# Patient Record
Sex: Male | Born: 1970 | Race: White | Hispanic: No | Marital: Married | Smoking: Never smoker
Health system: Southern US, Community
[De-identification: ages and names within clinical notes are randomized; demographics above are authoritative.]

## PROBLEM LIST (undated history)

## (undated) DIAGNOSIS — M1990 Unspecified osteoarthritis, unspecified site: Secondary | ICD-10-CM

## (undated) DIAGNOSIS — IMO0001 Reserved for inherently not codable concepts without codable children: Secondary | ICD-10-CM

## (undated) HISTORY — PX: COLONOSCOPY: SHX174

## (undated) HISTORY — DX: Reserved for inherently not codable concepts without codable children: IMO0001

## (undated) HISTORY — PX: OTHER SURGICAL HISTORY: SHX169

---

## 2014-10-18 ENCOUNTER — Ambulatory Visit (INDEPENDENT_AMBULATORY_CARE_PROVIDER_SITE_OTHER): Payer: BLUE CROSS/BLUE SHIELD | Admitting: Family Medicine

## 2014-10-18 ENCOUNTER — Encounter: Payer: Self-pay | Admitting: Family Medicine

## 2014-10-18 VITALS — BP 122/80 | Temp 98.5°F | Ht 69.0 in | Wt 203.0 lb

## 2014-10-18 DIAGNOSIS — Z0000 Encounter for general adult medical examination without abnormal findings: Secondary | ICD-10-CM

## 2014-10-18 NOTE — Patient Instructions (Signed)
Physical today-repeat in 1 year  Bring us a copy of your recent labs Have the front desk send for a copy of your records from [place]  Would recommend trying to push sleep at least to 7 hours  Keep an eye on 2 spots on the skin  No prostate cancer screening (rectal exam) until at least 51  Great to meet you!

## 2014-10-18 NOTE — Progress Notes (Signed)
  Tana ConchStephen Samson Ralph, MD Phone: [phone]  Subjective:  Patient presents today to establish care and for annual physical. Chief complaint-noted.  He exercises 5-6 days a week with crossfit and eats well. He is in excellent shaped. We discussed falsehood of BMI with his muscle mass.   The following were reviewed and entered/updated in epic: Past Medical History  Diagnosis Date  . Healthy adult    Past Surgical History  Procedure Laterality Date  . None      Family History  Problem Relation Age of Onset  . Healthy Father   . Healthy Mother   . Alcohol abuse Paternal Grandfather    Medications- none  Allergies-reviewed and updated No Known Allergies  History   Social History  . Marital Status: Married    Spouse Name: N/A  . Number of Children: N/A  . Years of Education: N/A   Social History Main Topics  . Smoking status: Never Smoker   . Smokeless tobacco: Not on file  . Alcohol Use: 1.2 - 3.6 oz/week    2-6 Standard drinks or equivalent per week  . Drug Use: No  . Sexual Activity:    Partners: Female     Comment: wife only   Other Topics Concern  . None   Social History Narrative   Married. Daughter in 10th grade (Dr. Selena BattenKim pt) and son in 8th grade in 2016 (Dr. Durene CalHunter pt).       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 Oropharynx normal. TM normal. Eyes: sclera and lids normal, PERRLA Neck: no thyromegaly, no cervical lymphadenopathy CV: RRR no murmurs rubs or gallops Lungs: CTAB no crackles, wheeze, rhonchi Abdomen: soft/nontender/nondistended/normal bowel sounds. No rebound or guarding.  Ext: no  edema Skin: warm, dry, full skin exam completed. Multiple moles and many atypical.  2 on watch 5 x 3 mm with irregular medial border on left upper back, 3x3 mm lesion R arm dark with slight border irregularity Neuro: 5/5 strength in upper and lower extremities, normal gait, normal reflexes  Assessment/Plan:  44 y.o. male presenting for annual physical.  Health Maintenance counseling: 1. Anticipatory guidance: Patient counseled regarding regular dental exams in [place], wearing seatbelts, wearing sunscreen. No dermatologist.  2. Risk factor reduction:  Advised patient of need for regular exercise and diet rich and fruits and vegetables to reduce risk of heart attack and stroke.  3. Immunizations/screenings/ancillary studies- up to date, once we get a copy of tetanus shot 4. Patient will bring by copy of labs  Return in 1 year. Keep an eye on 2 spots on skin exam. With multitude of moles, consider dermatology vs. Biopsies. Requesting records from [place].

## 2015-03-14 ENCOUNTER — Ambulatory Visit (INDEPENDENT_AMBULATORY_CARE_PROVIDER_SITE_OTHER): Payer: BLUE CROSS/BLUE SHIELD | Admitting: Family Medicine

## 2015-03-14 ENCOUNTER — Encounter: Payer: Self-pay | Admitting: Family Medicine

## 2015-03-14 VITALS — BP 172/90 | HR 64 | Temp 98.3°F | Wt 207.0 lb

## 2015-03-14 DIAGNOSIS — M7989 Other specified soft tissue disorders: Secondary | ICD-10-CM | POA: Diagnosis not present

## 2015-03-14 DIAGNOSIS — M79642 Pain in left hand: Secondary | ICD-10-CM

## 2015-03-14 NOTE — Progress Notes (Signed)
Tana Conch, MD  Subjective:  BASTIEN STRAWSER is a 44 y.o. year old very pleasant male patient who presents with:  Left hand pain and swelling -Wednesday working out at [employer]. Lifting 300 lbs, knee caught elbow when trying to push up and left wrist got bent back severely. Has been icing it about a dozen times. Pain mild to moderate in intensity. Swelling seems to be worsening. OTC analgesics prn for pain. Reduced hand grip strength. Difficult to pinpoint pain. Stable in course pain wise, worsening edema as noted.  ROS- no fevers, chills.   Past Medical History- never smoker, previously healthy  Medications- reviewed and updated- none  Objective: BP 172/90 mmHg  Pulse 64  Temp(Src) 98.3 F (36.8 C)  Wt 207 lb (93.895 kg) Gen: NAD CV: RRR no murmurs rubs or gallops Lungs: CTAB no crackles, wheeze, rhonchi Abdomen: soft/nontender/nondistended/normal bowel sounds.  Ext: Left hand with severe swelling throughout. Difficult to identify landmarks but appears to have increased pain in over scaphoid on left hand. Also has diffuse pain throughout proximal hand and hard to differentiate which are is worse. 4/5 grip strength compared to right. Entire hand warm to palpation. Normal strength with flexion and extension at wrist.  Skin: warm, dry Neuro: intact distal sensation, strength as above  Assessment/Plan:  Left hand pain and swelling Urgent referral to orthopedics- sent to after hours clinic at [facility]. crossfit injury- Watched video patient had and with 300 lbs force flexing wrist back- almost FOOSH type injury. Hyperflexion and pronounced swelling concerning. Worried about scaphoid fracture vs. Other etiology and do not think could safely work up in office today.

## 2015-03-14 NOTE — Patient Instructions (Signed)
With severity of your swelling in the hand/wrist, I think you need orthopedic evaluation, please go to the after hours clinic at [facility]. We called them and stated they open closer to 5 so may be reasonable to get there then.

## 2015-03-27 LAB — BASIC METABOLIC PANEL
BUN: 23 mg/dL — AB (ref 4–21)
Creatinine: 1.4 mg/dL — AB (ref 0.6–1.3)
Glucose: 97 mg/dL

## 2015-03-27 LAB — HEPATIC FUNCTION PANEL
ALK PHOS: 83 U/L (ref 25–125)
ALT: 35 U/L (ref 10–40)
AST: 42 U/L — AB (ref 14–40)

## 2015-03-27 LAB — LIPID PANEL
Cholesterol: 151 mg/dL (ref 0–200)
HDL: 47 mg/dL (ref 35–70)
LDL CALC: 86 mg/dL
Triglycerides: 89 mg/dL (ref 40–160)

## 2015-03-27 LAB — HEMOGLOBIN A1C: HEMOGLOBIN A1C: 5.1 % (ref 4.0–6.0)

## 2015-04-03 ENCOUNTER — Telehealth: Payer: Self-pay | Admitting: Family Medicine

## 2015-04-03 NOTE — Telephone Encounter (Signed)
Pt applied for life insurance and had abnormal ekg. Pt will bring ekg results. Can I use sda slot for next wk?

## 2015-04-03 NOTE — Telephone Encounter (Signed)
Pt has been scheduled.  °

## 2015-04-03 NOTE — Telephone Encounter (Signed)
See below

## 2015-04-03 NOTE — Telephone Encounter (Signed)
yes

## 2015-04-07 ENCOUNTER — Ambulatory Visit (INDEPENDENT_AMBULATORY_CARE_PROVIDER_SITE_OTHER): Payer: BLUE CROSS/BLUE SHIELD | Admitting: Family Medicine

## 2015-04-07 ENCOUNTER — Encounter: Payer: Self-pay | Admitting: Family Medicine

## 2015-04-07 VITALS — BP 130/70 | HR 74 | Temp 98.3°F | Wt 203.0 lb

## 2015-04-07 DIAGNOSIS — R9431 Abnormal electrocardiogram [ECG] [EKG]: Secondary | ICD-10-CM | POA: Diagnosis not present

## 2015-04-08 DIAGNOSIS — R9431 Abnormal electrocardiogram [ECG] [EKG]: Secondary | ICD-10-CM | POA: Insufficient documentation

## 2015-04-08 NOTE — Progress Notes (Signed)
Tana Conch, MD  Subjective:  Jose Cochran is a 44 y.o. year old very pleasant male patient who presents with:  Abnormal EKG -Patient brings EKG from life insurance company that was reportedly abnormal based on a t wave. He does have an isolated inverted t wave in lead III on EKG. He has some ST elevation in V2-V3 due to early repolarization. Patient is completely asymptomatic. EKG was done in recent weeks. No prior EKG has been completed due to being asymptomatic and no medical indication for EKG.   ROS- no chest pain, shortness of breath, palpitations, dizziness  Past Medical History- healthy athletic adult  Medications- reviewed and updated, none  Objective: BP 130/70 mmHg  Pulse 74  Temp(Src) 98.3 F (36.8 C)  Wt 203 lb (92.08 kg) Gen: NAD, resting comfortably in chair CV: RRR no murmurs rubs or gallops Lungs: CTAB no crackles, wheeze, rhonchi Abdomen: soft/nontender/nondistended/normal bowel sounds. No rebound or guarding.  Ext: no edema Skin: warm, dry, no rash Neuro: grossly normal, moves all extremities  EKG: rate 64, normal sinus rhythm, normal axis, no hypertrophy, st elevation in v2 and v3 due to early repolarization, isolated inverted t wave in III (given asymptomatic highly suspect benign)  Assessment/Plan:  Nonspecific abnormal electrocardiogram (ECG) (EKG) Reviewed EKG from life insurance and reviewed EKG in office. Patient has an isolated inverted t wave in III which has very high probability of being benign given healthy and asymptomatic. He has a repolarization abnormality causing st elevation in v2 and v3. Neither of these issues place him at increased cardiac risk or reflect the need to be in a higher tier for insurance. I will forward this note as well as any other needed information to the life insurance company. I am happy to speak with a physician from that company to discuss my findings.    Orders Placed This Encounter  Procedures  . EKG 12-Lead

## 2015-04-08 NOTE — Assessment & Plan Note (Signed)
Reviewed EKG from life insurance and reviewed EKG in office. Patient has an isolated inverted t wave in III which has very high probability of being benign given healthy and asymptomatic. He has a repolarization abnormality causing st elevation in v2 and v3. Neither of these issues place him at increased cardiac risk or reflect the need to be in a higher tier for insurance. I will forward this note as well as any other needed information to the life insurance company. I am happy to speak with a physician from that company to discuss my findings.

## 2015-04-09 ENCOUNTER — Encounter: Payer: Self-pay | Admitting: Family Medicine

## 2015-04-09 LAB — HIV ANTIBODY: HIV: NEGATIVE

## 2015-04-10 NOTE — Progress Notes (Signed)
Paperwork faxed °

## 2015-04-10 NOTE — Progress Notes (Signed)
Called and lm for pt tcb. 

## 2016-03-05 ENCOUNTER — Other Ambulatory Visit: Payer: Self-pay | Admitting: Orthopedic Surgery

## 2016-03-05 DIAGNOSIS — M25561 Pain in right knee: Secondary | ICD-10-CM

## 2016-03-08 ENCOUNTER — Ambulatory Visit (INDEPENDENT_AMBULATORY_CARE_PROVIDER_SITE_OTHER): Payer: BLUE CROSS/BLUE SHIELD

## 2016-03-08 DIAGNOSIS — M7121 Synovial cyst of popliteal space [Baker], right knee: Secondary | ICD-10-CM

## 2016-03-08 DIAGNOSIS — M25461 Effusion, right knee: Secondary | ICD-10-CM

## 2016-03-08 DIAGNOSIS — M25561 Pain in right knee: Secondary | ICD-10-CM

## 2016-03-08 DIAGNOSIS — M23221 Derangement of posterior horn of medial meniscus due to old tear or injury, right knee: Secondary | ICD-10-CM

## 2016-03-23 HISTORY — PX: ARTHOSCOPIC ROTAOR CUFF REPAIR: SHX5002

## 2017-03-17 IMAGING — MR MR KNEE*R* W/O CM
5 series · 37 of 40 positions shown · non-contrast
Comparison: None.

CLINICAL DATA: Knee pain and swelling starting in November 2015 with
limited mobility.

EXAM:
MRI OF THE RIGHT KNEE WITHOUT CONTRAST
TECHNIQUE: Multiplanar, multisequence MR imaging of the knee was performed. No
intravenous contrast was administered.

[Series 5: PD fat-sat · axial · 3.0mm · 0.33mm/px · z∈[-75,+41]mm · 8 of 36 slices shown (1 of 3)]
[im 1/36]
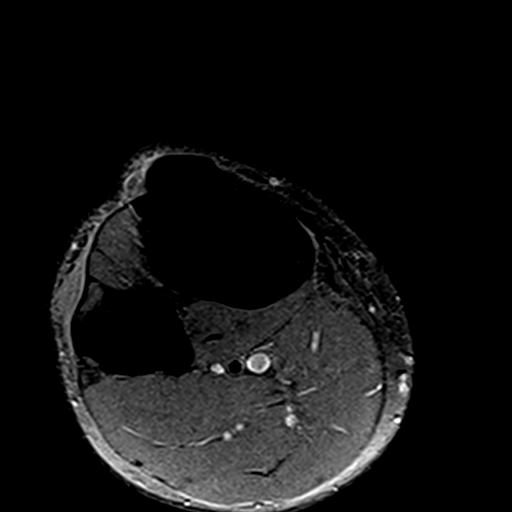
[im 6/36]
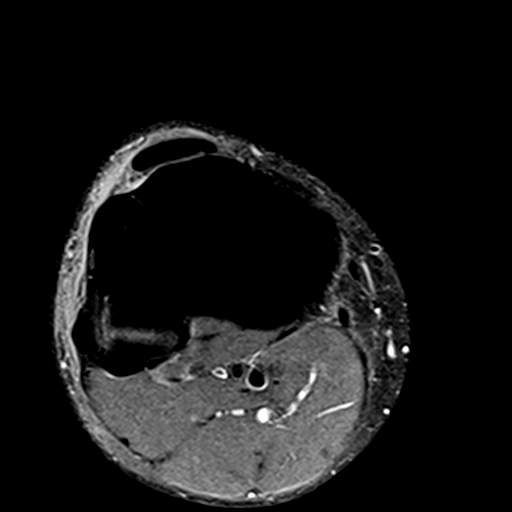
[im 11/36]
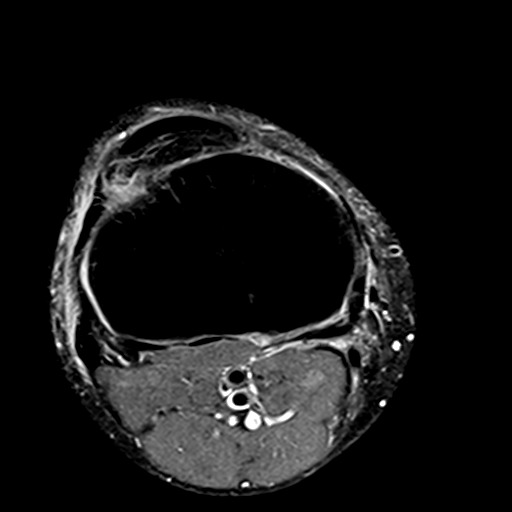
[im 16/36]
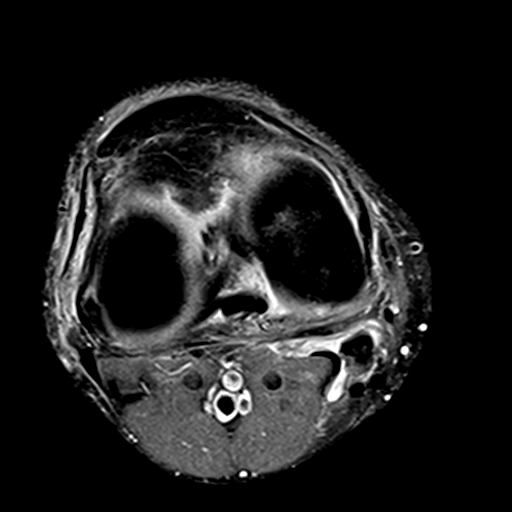
[im 21/36]
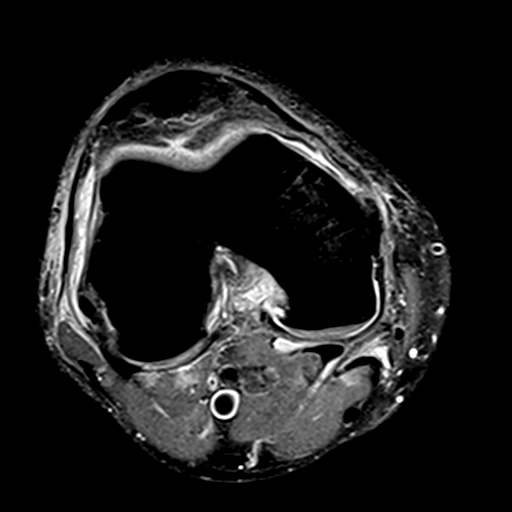
[im 26/36]
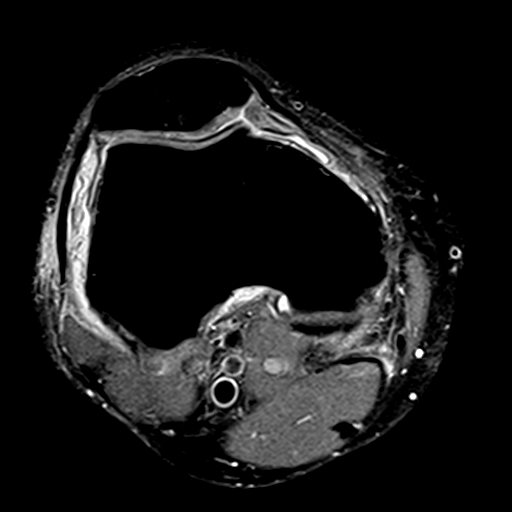
[im 31/36]
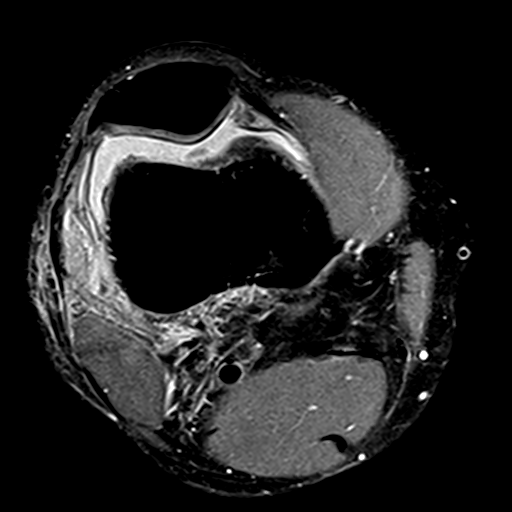
[im 36/36]
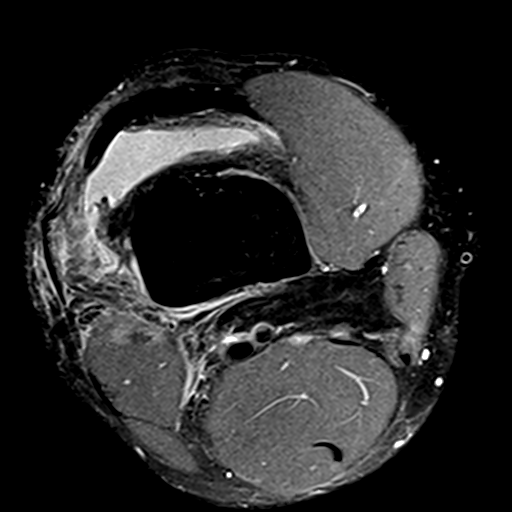

[Series 6: T1 · coronal · 3.0mm · 0.50mm/px · 5 of 35 slices shown]
[im 1/35]
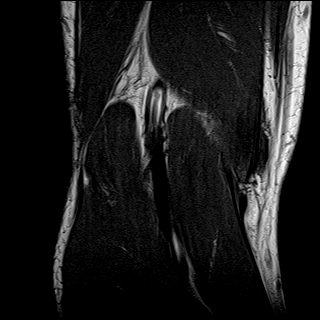
[im 5/35]
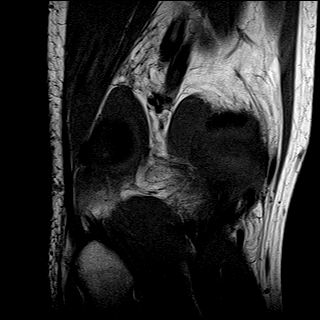
[im 10/35]
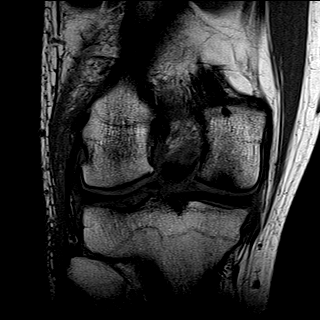
[im 15/35]
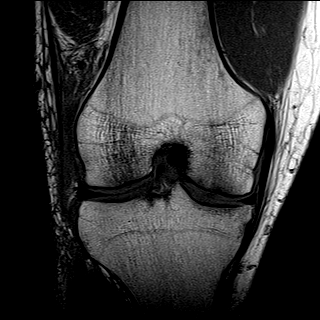
[im 20/35]
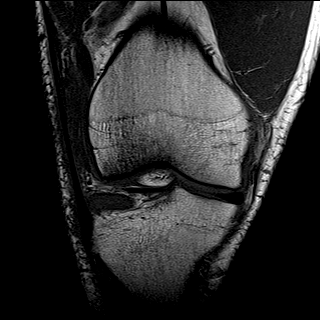

[Series 7: T2 fat-sat · coronal · 3.0mm · 0.50mm/px · 8 of 35 slices shown]
[im 1/35]
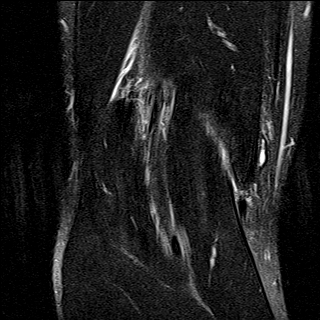
[im 5/35]
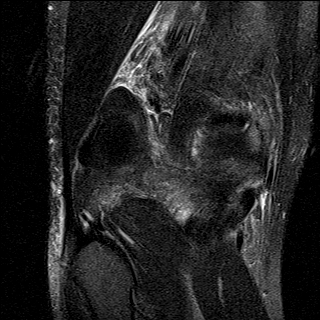
[im 10/35]
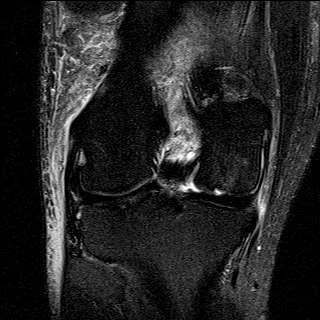
[im 15/35]
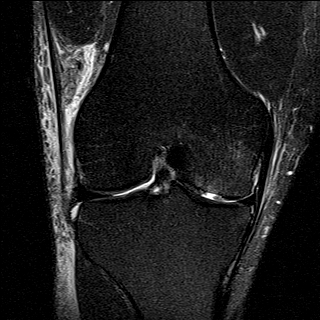
[im 20/35]
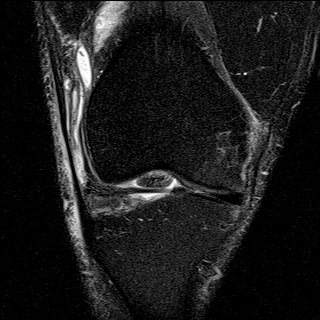
[im 25/35]
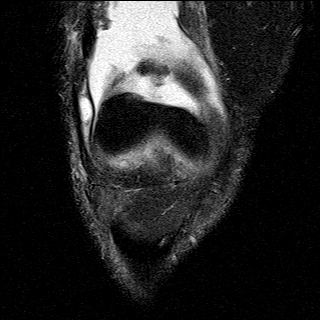
[im 30/35]
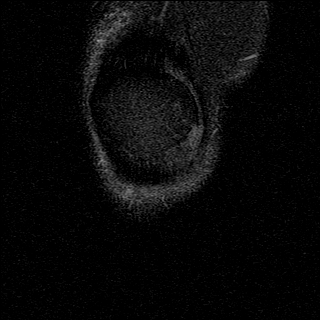
[im 35/35]
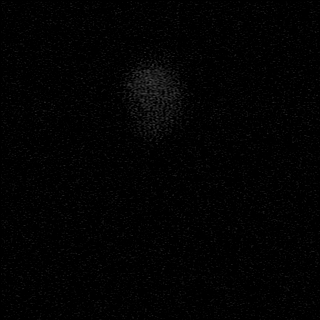

[Series 8: PD fat-sat · sagittal · 3.0mm · 0.62mm/px · 8 of 36 slices shown (2 of 3)]
[im 1/36]
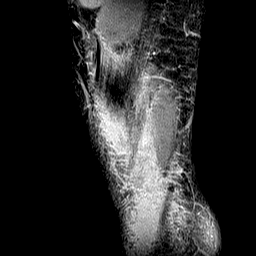
[im 6/36]
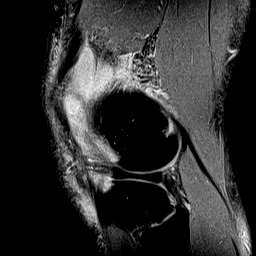
[im 11/36]
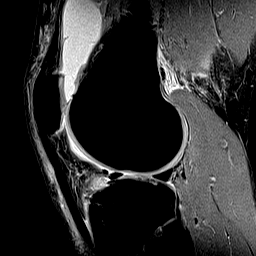
[im 16/36]
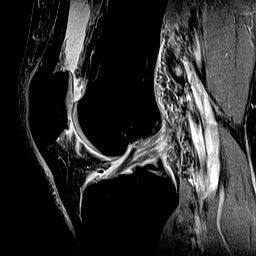
[im 21/36]
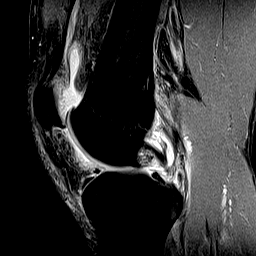
[im 26/36]
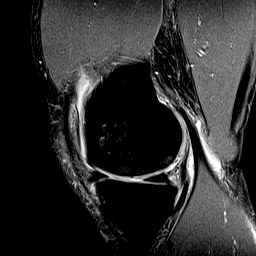
[im 31/36]
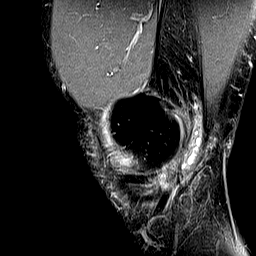
[im 36/36]
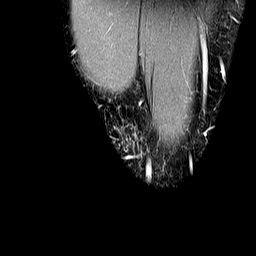

[Series 9: PD fat-sat · coronal · 3.0mm · 0.62mm/px · 8 of 35 slices shown (3 of 3)]
[im 1/35]
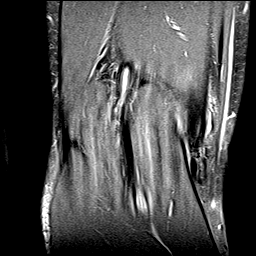
[im 5/35]
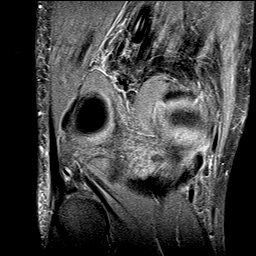
[im 10/35]
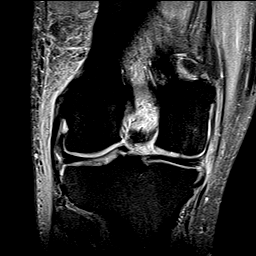
[im 15/35]
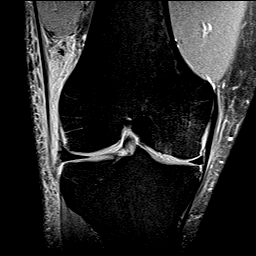
[im 20/35]
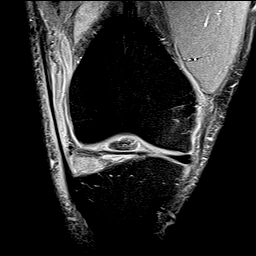
[im 25/35]
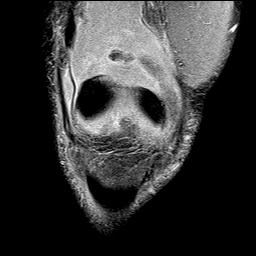
[im 30/35]
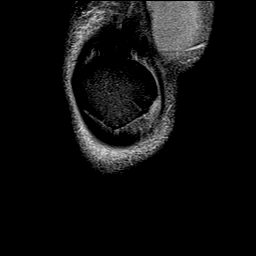
[im 35/35]
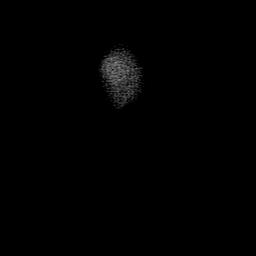

[37 of 40 positions shown; findings below may reference images not displayed]

FINDINGS: MENISCI

Medial meniscus: Irregular grade 3 peripheral oblique tear of the
posterior horn involving the inferior meniscal surface on images
29-30 series 8

Lateral meniscus:  No tear.  Incomplete discoid variant.

LIGAMENTS

Cruciates:  Unremarkable

Collaterals: Mild edema tracks adjacent to the MCL. This can be
incidental but in the appropriate clinical circumstance could
represent grade 1 sprain. There is considerable edema along the
anterolateral knee soft tissues but no discontinuity of the fibular
collateral ligament are popliteus tendon.

CARTILAGE

Patellofemoral: Mild chondral heterogeneity inferiorly in the
patellar cartilage with faint degenerative subcortical foci of edema
along the inferior patella.

Medial: Triangular full-thickness chondral defect in the articular
surface of the medial femoral condyle measures up to 9 mm at its
base and up to 33 mm in length, sagittally oriented, with associated
subcortical marrow edema, as shown on images 12-14 series 7.
Underlying mild chondral thinning noted.

Lateral: Mild chondral heterogeneity along the lateral tibial
plateau.

Joint:  Moderate to large knee effusion.

Popliteal Fossa:  Small Baker's cyst.

Extensor Mechanism: Edema tracks around the lateral patellar
retinaculum.

Bones: No significant extra-articular osseous abnormalities
identified.

Other: No supplemental non-categorized findings.
IMPRESSION: 1. Large full-thickness chondral defect oriented sagittally along
the medial femoral condyle with underlying subcortical marrow edema.
2. Small irregular grade 3 peripheral oblique tear of the posterior
horn medial meniscus involving the inferior meniscal surface and
meniscal periphery.
3. Mild edema tracks adjacent to the MCL. This can be incidental but
in the appropriate clinical circumstance could represent grade 1
sprain.
4. Edema tracks along the antrum lateral knee and a sprain of the
lateral patellar retinaculum is not excluded.
5. Moderate to large knee effusion with small Baker's cyst.
6. Mild chondral heterogeneity inferiorly in the patella and in the
lateral tibial plateau potentially reflecting mild chondromalacia.

## 2023-06-06 ENCOUNTER — Ambulatory Visit: Payer: Self-pay | Admitting: Orthopedic Surgery

## 2023-08-01 NOTE — Patient Instructions (Addendum)
DUE TO COVID-19 ONLY TWO VISITORS  (aged 51 and older)  ARE ALLOWED TO COME WITH YOU AND STAY IN THE WAITING ROOM ONLY DURING PRE OP AND PROCEDURE.   **NO VISITORS ARE ALLOWED IN THE SHORT STAY AREA OR RECOVERY ROOM!!**  IF YOU WILL BE ADMITTED INTO THE HOSPITAL YOU ARE ALLOWED ONLY FOUR SUPPORT PEOPLE DURING VISITATION HOURS ONLY (7 AM -8PM)   The support person(s) must pass our screening, gel in and out, and wear a mask at all times, including in the patient's room. Patients must also wear a mask when staff or their support person are in the room. Visitors GUEST BADGE MUST BE WORN VISIBLY  One adult visitor may remain with you overnight and MUST be in the room by 8 P.M.     Your procedure is scheduled on: 08/10/23   Report to [facility] Main Entrance    Report to admitting at : 6:00 AM   Call this number if you have problems the morning of surgery [phone]   Do not eat food :After Midnight.   After Midnight you may have the following liquids until : 5:30 AM DAY OF SURGERY  Water Black Coffee (sugar ok, NO MILK/CREAM OR CREAMERS)  Tea (sugar ok, NO MILK/CREAM OR CREAMERS) regular and decaf                             Plain Jell-O (NO RED)                                           Fruit ices (not with fruit pulp, NO RED)                                     Popsicles (NO RED)                                                                  Juice: apple, WHITE grape, WHITE cranberry Sports drinks like Gatorade (NO RED)   The day of surgery:  Drink ONE (1) Pre-Surgery Clear Ensure at : 5:30 AM the morning of surgery. Drink in one sitting. Do not sip.  This drink was given to you during your hospital  pre-op appointment visit. Nothing else to drink after completing the  Pre-Surgery Clear Ensure or G2.          If you have questions, please contact your surgeon's office.  FOLLOW ANY ADDITIONAL PRE OP INSTRUCTIONS YOU RECEIVED FROM YOUR SURGEON'S OFFICE!!!   Oral  Hygiene is also important to reduce your risk of infection.                                    Remember - BRUSH YOUR TEETH THE MORNING OF SURGERY WITH YOUR REGULAR TOOTHPASTE  DENTURES WILL BE REMOVED PRIOR TO SURGERY PLEASE DO NOT APPLY "Poly grip" OR ADHESIVES!!!   Do NOT smoke after Midnight   Take these medicines the morning of surgery  with A SIP OF WATER: NONE.                              You may not have any metal on your body including hair pins, jewelry, and body piercing             Do not wear lotions, powders, perfumes/cologne, or deodorant              Men may shave face and neck.   Do not bring valuables to the hospital. [facility] IS NOT             RESPONSIBLE   FOR VALUABLES.   Contacts, glasses, or bridgework may not be worn into surgery.   Bring small overnight bag day of surgery.   DO NOT BRING YOUR HOME MEDICATIONS TO THE HOSPITAL. PHARMACY WILL DISPENSE MEDICATIONS LISTED ON YOUR MEDICATION LIST TO YOU DURING YOUR ADMISSION IN THE HOSPITAL!    Patients discharged on the day of surgery will not be allowed to drive home.  Someone NEEDS to stay with you for the first 24 hours after anesthesia.   Special Instructions: Bring a copy of your healthcare power of attorney and living will documents         the day of surgery if you haven't scanned them before.              Please read over the following fact sheets you were given: IF YOU HAVE QUESTIONS ABOUT YOUR PRE-OP INSTRUCTIONS PLEASE CALL [phone]      Pre-operative 5 CHG Bath Instructions   You can play a key role in reducing the risk of infection after surgery. Your skin needs to be as free of germs as possible. You can reduce the number of germs on your skin by washing with CHG (chlorhexidine gluconate) soap before surgery. CHG is an antiseptic soap that kills germs and continues to kill germs even after washing.   DO NOT use if you have an allergy to chlorhexidine/CHG or antibacterial soaps. If your  skin becomes reddened or irritated, stop using the CHG and notify one of our RNs at : [phone].   Please shower with the CHG soap starting 4 days before surgery using the following schedule:     Please keep in mind the following:  DO NOT shave, including legs and underarms, starting the day of your first shower.   You may shave your face at any point before/day of surgery.  Place clean sheets on your bed the day you start using CHG soap. Use a clean washcloth (not used since being washed) for each shower. DO NOT sleep with pets once you start using the CHG.   CHG Shower Instructions:  If you choose to wash your hair and private area, wash first with your normal shampoo/soap.  After you use shampoo/soap, rinse your hair and body thoroughly to remove shampoo/soap residue.  Turn the water OFF and apply about 3 tablespoons (45 ml) of CHG soap to a CLEAN washcloth.  Apply CHG soap ONLY FROM YOUR NECK DOWN TO YOUR TOES (washing for 3-5 minutes)  DO NOT use CHG soap on face, private areas, open wounds, or sores.  Pay special attention to the area where your surgery is being performed.  If you are having back surgery, having someone wash your back for you may be helpful. Wait 2 minutes after CHG soap is applied, then you may rinse off  the CHG soap.  Pat dry with a clean towel  Put on clean clothes/pajamas   If you choose to wear lotion, please use ONLY the CHG-compatible lotions on the back of this paper.     Additional instructions for the day of surgery: DO NOT APPLY any lotions, deodorants, cologne, or perfumes.   Put on clean/comfortable clothes.  Brush your teeth.  Ask your nurse before applying any prescription medications to the skin.   CHG Compatible Lotions   Aveeno Moisturizing lotion  Cetaphil Moisturizing Cream  Cetaphil Moisturizing Lotion  Clairol Herbal Essence Moisturizing Lotion, Dry Skin  Clairol Herbal Essence Moisturizing Lotion, Extra Dry Skin  Clairol Herbal  Essence Moisturizing Lotion, Normal Skin  Curel Age Defying Therapeutic Moisturizing Lotion with Alpha Hydroxy  Curel Extreme Care Body Lotion  Curel Soothing Hands Moisturizing Hand Lotion  Curel Therapeutic Moisturizing Cream, Fragrance-Free  Curel Therapeutic Moisturizing Lotion, Fragrance-Free  Curel Therapeutic Moisturizing Lotion, Original Formula  Eucerin Daily Replenishing Lotion  Eucerin Dry Skin Therapy Plus Alpha Hydroxy Crme  Eucerin Dry Skin Therapy Plus Alpha Hydroxy Lotion  Eucerin Original Crme  Eucerin Original Lotion  Eucerin Plus Crme Eucerin Plus Lotion  Eucerin TriLipid Replenishing Lotion  Keri Anti-Bacterial Hand Lotion  Keri Deep Conditioning Original Lotion Dry Skin Formula Softly Scented  Keri Deep Conditioning Original Lotion, Fragrance Free Sensitive Skin Formula  Keri Lotion Fast Absorbing Fragrance Free Sensitive Skin Formula  Keri Lotion Fast Absorbing Softly Scented Dry Skin Formula  Keri Original Lotion  Keri Skin Renewal Lotion Keri Silky Smooth Lotion  Keri Silky Smooth Sensitive Skin Lotion  Nivea Body Creamy Conditioning Oil  Nivea Body Extra Enriched Lotion  Nivea Body Original Lotion  Nivea Body Sheer Moisturizing Lotion Nivea Crme  Nivea Skin Firming Lotion  NutraDerm 30 Skin Lotion  NutraDerm Skin Lotion  NutraDerm Therapeutic Skin Cream  NutraDerm Therapeutic Skin Lotion  ProShield Protective Hand Cream  Provon moisturizing lotion   Incentive Spirometer  An incentive spirometer is a tool that can help keep your lungs clear and active. This tool measures how well you are filling your lungs with each breath. Taking long deep breaths may help reverse or decrease the chance of developing breathing (pulmonary) problems (especially infection) following: A long period of time when you are unable to move or be active. BEFORE THE PROCEDURE  If the spirometer includes an indicator to show your best effort, your nurse or respiratory therapist  will set it to a desired goal. If possible, sit up straight or lean slightly forward. Try not to slouch. Hold the incentive spirometer in an upright position. INSTRUCTIONS FOR USE  Sit on the edge of your bed if possible, or sit up as far as you can in bed or on a chair. Hold the incentive spirometer in an upright position. Breathe out normally. Place the mouthpiece in your mouth and seal your lips tightly around it. Breathe in slowly and as deeply as possible, raising the piston or the ball toward the top of the column. Hold your breath for 3-5 seconds or for as long as possible. Allow the piston or ball to fall to the bottom of the column. Remove the mouthpiece from your mouth and breathe out normally. Rest for a few seconds and repeat Steps 1 through 7 at least 10 times every 1-2 hours when you are awake. Take your time and take a few normal breaths between deep breaths. The spirometer may include an indicator to show your best effort. Use the indicator  as a goal to work toward during each repetition. After each set of 10 deep breaths, practice coughing to be sure your lungs are clear. If you have an incision (the cut made at the time of surgery), support your incision when coughing by placing a pillow or rolled up towels firmly against it. Once you are able to get out of bed, walk around indoors and cough well. You may stop using the incentive spirometer when instructed by your caregiver.  RISKS AND COMPLICATIONS Take your time so you do not get dizzy or light-headed. If you are in pain, you may need to take or ask for pain medication before doing incentive spirometry. It is harder to take a deep breath if you are having pain. AFTER USE Rest and breathe slowly and easily. It can be helpful to keep track of a log of your progress. Your caregiver can provide you with a simple table to help with this. If you are using the spirometer at home, follow these instructions: SEEK MEDICAL CARE IF:   You are having difficultly using the spirometer. You have trouble using the spirometer as often as instructed. Your pain medication is not giving enough relief while using the spirometer. You develop fever of 100.5 F (38.1 C) or higher. SEEK IMMEDIATE MEDICAL CARE IF:  You cough up bloody sputum that had not been present before. You develop fever of 102 F (38.9 C) or greater. You develop worsening pain at or near the incision site. MAKE SURE YOU:  Understand these instructions. Will watch your condition. Will get help right away if you are not doing well or get worse. Document Released: 12/20/2006 Document Revised: 11/01/2011 Document Reviewed: 02/20/2007 [facility] Patient Information 2014 [place], [place].   ________________________________________________________________________

## 2023-08-02 ENCOUNTER — Encounter (HOSPITAL_COMMUNITY)
Admission: RE | Admit: 2023-08-02 | Discharge: 2023-08-02 | Disposition: A | Discharge status: Home / Self Care | Payer: BC Managed Care – PPO | Source: Ambulatory Visit | Attending: Specialist | Admitting: Specialist

## 2023-08-02 ENCOUNTER — Other Ambulatory Visit: Payer: Self-pay

## 2023-08-02 ENCOUNTER — Encounter (HOSPITAL_COMMUNITY): Payer: Self-pay

## 2023-08-02 VITALS — BP 167/108 | HR 79 | Temp 98.6°F | Ht 67.0 in | Wt 203.0 lb

## 2023-08-02 DIAGNOSIS — Z01812 Encounter for preprocedural laboratory examination: Secondary | ICD-10-CM | POA: Diagnosis present

## 2023-08-02 DIAGNOSIS — R9431 Abnormal electrocardiogram [ECG] [EKG]: Secondary | ICD-10-CM | POA: Insufficient documentation

## 2023-08-02 DIAGNOSIS — Z01818 Encounter for other preprocedural examination: Secondary | ICD-10-CM | POA: Diagnosis not present

## 2023-08-02 DIAGNOSIS — Z01810 Encounter for preprocedural cardiovascular examination: Secondary | ICD-10-CM | POA: Diagnosis present

## 2023-08-02 HISTORY — DX: Unspecified osteoarthritis, unspecified site: M19.90

## 2023-08-02 LAB — SURGICAL PCR SCREEN
MRSA, PCR: NEGATIVE
Staphylococcus aureus: NEGATIVE

## 2023-08-02 LAB — CBC
HCT: 51.4 % (ref 39.0–52.0)
Hemoglobin: 17 g/dL (ref 13.0–17.0)
MCH: 29.3 pg (ref 26.0–34.0)
MCHC: 33.1 g/dL (ref 30.0–36.0)
MCV: 88.5 fL (ref 80.0–100.0)
Platelets: 329 10*3/uL (ref 150–400)
RBC: 5.81 MIL/uL (ref 4.22–5.81)
RDW: 12.8 % (ref 11.5–15.5)
WBC: 7.7 10*3/uL (ref 4.0–10.5)
nRBC: 0 % (ref 0.0–0.2)

## 2023-08-02 LAB — BASIC METABOLIC PANEL
Anion gap: 10 (ref 5–15)
BUN: 17 mg/dL (ref 6–20)
CO2: 25 mmol/L (ref 22–32)
Calcium: 9.6 mg/dL (ref 8.9–10.3)
Chloride: 100 mmol/L (ref 98–111)
Creatinine, Ser: 1.39 mg/dL — ABNORMAL HIGH (ref 0.61–1.24)
GFR, Estimated: >60 mL/min (ref >60)
Glucose, Bld: 97 mg/dL (ref 70–99)
Potassium: 4.4 mmol/L (ref 3.5–5.1)
Sodium: 135 mmol/L (ref 135–145)

## 2023-08-02 NOTE — Progress Notes (Addendum)
For Anesthesia: PCP - NO PCP. Last physical: 03/12/22: Rudy Jew Sent  Cardiologist - N/A CT cardiac: 03/19/22: CEW. As per pt. He requested the test. Bowel Prep reminder:  Chest x-ray -  EKG - 08/02/23 Stress Test -  ECHO -  Cardiac Cath -  Pacemaker/ICD device last checked: Pacemaker orders received: Device Rep notified:  Spinal Cord Stimulator:N/A  Sleep Study - N/A CPAP -   Fasting Blood Sugar - N/A Checks Blood Sugar _____ times a day Date and result of last Hgb A1c-  Last dose of GLP1 agonist- N/A GLP1 instructions:   Last dose of SGLT-2 inhibitors- N/A SGLT-2 instructions:   Blood Thinner Instructions:N/A Aspirin Instructions: Last Dose:  Activity level: Can go up a flight of stairs and activities of daily living without stopping and without chest pain and/or shortness of breath   Able to exercise without chest pain and/or shortness of breath  Anesthesia review: BP was elevated during PST appointment ( 167/108 ; 188/197).As per pt.,BP is normal when he check it at home.Pt. is aware of the chance for surgery cancellation is BP is hight the day of surgery.He was advised to go to urgent care if BP continues high.  Patient denies shortness of breath, fever, cough and chest pain at PAT appointment   Patient verbalized understanding of instructions that were given to them at the PAT appointment. Patient was also instructed that they will need to review over the PAT instructions again at home before surgery.

## 2023-08-05 ENCOUNTER — Ambulatory Visit: Payer: Self-pay | Admitting: Orthopedic Surgery

## 2023-08-05 NOTE — H&P (View-Only) (Signed)
Jose Cochran is an 52 y.o. male.   Chief Complaint: right knee pain HPI: H&P right TKA with Dr. Shelle Iron scheduled for 08/10/23 at [facility].  Dr. Shelle Iron and the patient mutually agreed to proceed with a total knee replacement. Risks and benefits of the procedure were discussed including stiffness, suboptimal range of motion, persistent pain, infection requiring removal of prosthesis and reinsertion, need for prophylactic antibiotics in the future, for example, dental procedures, possible need for manipulation, revision in the future and also anesthetic complications including DVT, PE, etc. We discussed the perioperative course, time in the hospital, postoperative recovery and the need for elevation to control swelling. We also discussed the predicted range of motion and the probability that squatting and kneeling would be unobtainable in the future. In addition, postoperative anticoagulation was discussed. We have obtained preoperative medical clearance as necessary. Provided illustrated handout and discussed it in detail. They will enroll in the total joint replacement educational forum at the hospital.  Past Medical History:  Diagnosis Date   Arthritis    Healthy adult     Past Surgical History:  Procedure Laterality Date   ARTHOSCOPIC ROTAOR CUFF REPAIR Right 03/2016   COLONOSCOPY     none      Family History  Problem Relation Age of Onset   Healthy Father    Healthy Mother    Alcohol abuse Paternal Grandfather    Social History:  reports that he has never smoked. He does not have any smokeless tobacco history on file. He reports current alcohol use of about 2.0 - 6.0 standard drinks of alcohol per week. He reports that he does not use drugs.  Allergies: No Known Allergies  No current medications  Review of Systems  Constitutional: Negative.   HENT: Negative.    Eyes: Negative.   Respiratory: Negative.    Cardiovascular: Negative.   Gastrointestinal: Negative.   Endocrine:  Negative.   Genitourinary: Negative.   Musculoskeletal:  Positive for arthralgias, gait problem, joint swelling and myalgias.  Skin: Negative.   Hematological: Negative.   Psychiatric/Behavioral: Negative.      There were no vitals taken for this visit. Physical Exam Constitutional:      Appearance: Normal appearance.  HENT:     Head: Normocephalic and atraumatic.     Right Ear: External ear normal.     Left Ear: External ear normal.     Nose: Nose normal.     Mouth/Throat:     Pharynx: Oropharynx is clear.  Eyes:     Conjunctiva/sclera: Conjunctivae normal.  Cardiovascular:     Rate and Rhythm: Normal rate and regular rhythm.     Pulses: Normal pulses.     Heart sounds: Normal heart sounds.  Pulmonary:     Effort: Pulmonary effort is normal.     Breath sounds: Normal breath sounds.  Abdominal:     General: Bowel sounds are normal.  Musculoskeletal:     Cervical back: Normal range of motion.     Comments: Tender to palpation on medial joint line. Slight varus deformity. Good range of motion. No instability. He reports a flatfoot.  Skin:    General: Skin is warm and dry.  Neurological:     Mental Status: He is alert.      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 Shelle Iron on December 18. We again discussed the  procedure itself as well as risks, complications and alternatives, including but not limited to DVT, PE, infx, bleeding, failure of procedure, need for secondary procedure including manipulation, nerve injury, ongoing pain/symptoms, anesthesia risk, even stroke or death. Also discussed typical post-op protocols, activity restrictions, need for PT, flexion/extension exercises, time out of work. Discussed need for DVT ppx post-op per protocol. Discussed dental ppx and infx  prevention. Also discussed limitations post-operatively such as kneeling and squatting. All questions were answered. Patient desires to proceed with surgery as scheduled.  Will hold supplements, ASA and NSAIDs accordingly. Will remain NPO after midnight the night before surgery. Will present to [facility] for pre-op testing. Anticipate hospital stay to include at least 2 midnights given medical history and to ensure proper pain control. Plan aspirin for DVT ppx post-op. Plan oxycodone, Robaxin, Colace, Miralax. Plan home with HHPT post-op with family members at home for assistance. Will follow up 10-14 days post-op for staple removal and xrays.  Plan Right total knee replacement  Dorothy Spark, PA-C for Dr Shelle Iron 08/05/2023, 8:23 AM

## 2023-08-05 NOTE — H&P (Signed)
Jose Cochran is an 53 y.o. male.   Chief Complaint: right knee pain HPI: H&P right TKA with Dr. Shelle Iron scheduled for 08/10/23 at [facility].  Dr. Shelle Iron and the patient mutually agreed to proceed with a total knee replacement. Risks and benefits of the procedure were discussed including stiffness, suboptimal range of motion, persistent pain, infection requiring removal of prosthesis and reinsertion, need for prophylactic antibiotics in the future, for example, dental procedures, possible need for manipulation, revision in the future and also anesthetic complications including DVT, PE, etc. We discussed the perioperative course, time in the hospital, postoperative recovery and the need for elevation to control swelling. We also discussed the predicted range of motion and the probability that squatting and kneeling would be unobtainable in the future. In addition, postoperative anticoagulation was discussed. We have obtained preoperative medical clearance as necessary. Provided illustrated handout and discussed it in detail. They will enroll in the total joint replacement educational forum at the hospital.  Past Medical History:  Diagnosis Date   Arthritis    Healthy adult     Past Surgical History:  Procedure Laterality Date   ARTHOSCOPIC ROTAOR CUFF REPAIR Right 03/2016   COLONOSCOPY     none      Family History  Problem Relation Age of Onset   Healthy Father    Healthy Mother    Alcohol abuse Paternal Grandfather    Social History:  reports that he has never smoked. He does not have any smokeless tobacco history on file. He reports current alcohol use of about 2.0 - 6.0 standard drinks of alcohol per week. He reports that he does not use drugs.  Allergies: No Known Allergies  No current medications  Review of Systems  Constitutional: Negative.   HENT: Negative.    Eyes: Negative.   Respiratory: Negative.    Cardiovascular: Negative.   Gastrointestinal: Negative.   Endocrine:  Negative.   Genitourinary: Negative.   Musculoskeletal:  Positive for arthralgias, gait problem, joint swelling and myalgias.  Skin: Negative.   Hematological: Negative.   Psychiatric/Behavioral: Negative.      There were no vitals taken for this visit. Physical Exam Constitutional:      Appearance: Normal appearance.  HENT:     Head: Normocephalic and atraumatic.     Right Ear: External ear normal.     Left Ear: External ear normal.     Nose: Nose normal.     Mouth/Throat:     Pharynx: Oropharynx is clear.  Eyes:     Conjunctiva/sclera: Conjunctivae normal.  Cardiovascular:     Rate and Rhythm: Normal rate and regular rhythm.     Pulses: Normal pulses.     Heart sounds: Normal heart sounds.  Pulmonary:     Effort: Pulmonary effort is normal.     Breath sounds: Normal breath sounds.  Abdominal:     General: Bowel sounds are normal.  Musculoskeletal:     Cervical back: Normal range of motion.     Comments: Tender to palpation on medial joint line. Slight varus deformity. Good range of motion. No instability. He reports a flatfoot.  Skin:    General: Skin is warm and dry.  Neurological:     Mental Status: He is alert.      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 Shelle Iron on December 18. We again discussed the  procedure itself as well as risks, complications and alternatives, including but not limited to DVT, PE, infx, bleeding, failure of procedure, need for secondary procedure including manipulation, nerve injury, ongoing pain/symptoms, anesthesia risk, even stroke or death. Also discussed typical post-op protocols, activity restrictions, need for PT, flexion/extension exercises, time out of work. Discussed need for DVT ppx post-op per protocol. Discussed dental ppx and infx  prevention. Also discussed limitations post-operatively such as kneeling and squatting. All questions were answered. Patient desires to proceed with surgery as scheduled.  Will hold supplements, ASA and NSAIDs accordingly. Will remain NPO after midnight the night before surgery. Will present to [facility] for pre-op testing. Anticipate hospital stay to include at least 2 midnights given medical history and to ensure proper pain control. Plan aspirin for DVT ppx post-op. Plan oxycodone, Robaxin, Colace, Miralax. Plan home with HHPT post-op with family members at home for assistance. Will follow up 10-14 days post-op for staple removal and xrays.  Plan Right total knee replacement  Dorothy Spark, PA-C for Dr Shelle Iron 08/05/2023, 8:23 AM

## 2023-08-10 ENCOUNTER — Encounter (HOSPITAL_COMMUNITY): Payer: Self-pay | Admitting: Specialist

## 2023-08-10 ENCOUNTER — Encounter (HOSPITAL_COMMUNITY)
Admission: RE | Disposition: A | Discharge status: Home / Self Care | Payer: Self-pay | Source: Ambulatory Visit | Attending: Specialist

## 2023-08-10 ENCOUNTER — Ambulatory Visit (HOSPITAL_COMMUNITY)
Admission: RE | Admit: 2023-08-10 | Discharge: 2023-08-11 | Disposition: A | Discharge status: Home / Self Care | Payer: BC Managed Care – PPO | Source: Ambulatory Visit | Attending: Specialist | Admitting: Specialist

## 2023-08-10 ENCOUNTER — Other Ambulatory Visit: Payer: Self-pay

## 2023-08-10 ENCOUNTER — Ambulatory Visit (HOSPITAL_COMMUNITY): Payer: BC Managed Care – PPO | Admitting: Anesthesiology

## 2023-08-10 ENCOUNTER — Ambulatory Visit (HOSPITAL_COMMUNITY): Payer: BC Managed Care – PPO

## 2023-08-10 ENCOUNTER — Other Ambulatory Visit (HOSPITAL_COMMUNITY): Payer: Self-pay

## 2023-08-10 ENCOUNTER — Ambulatory Visit (HOSPITAL_COMMUNITY): Payer: BC Managed Care – PPO | Admitting: Physician Assistant

## 2023-08-10 DIAGNOSIS — M791 Myalgia, unspecified site: Secondary | ICD-10-CM | POA: Diagnosis not present

## 2023-08-10 DIAGNOSIS — Z23 Encounter for immunization: Secondary | ICD-10-CM | POA: Insufficient documentation

## 2023-08-10 DIAGNOSIS — M1711 Unilateral primary osteoarthritis, right knee: Secondary | ICD-10-CM | POA: Diagnosis present

## 2023-08-10 DIAGNOSIS — M21161 Varus deformity, not elsewhere classified, right knee: Secondary | ICD-10-CM | POA: Insufficient documentation

## 2023-08-10 DIAGNOSIS — R2689 Other abnormalities of gait and mobility: Secondary | ICD-10-CM | POA: Insufficient documentation

## 2023-08-10 DIAGNOSIS — M2550 Pain in unspecified joint: Secondary | ICD-10-CM | POA: Diagnosis not present

## 2023-08-10 HISTORY — PX: TOTAL KNEE ARTHROPLASTY: SHX125

## 2023-08-10 SURGERY — ARTHROPLASTY, KNEE, TOTAL
Anesthesia: Monitor Anesthesia Care | Site: Knee | Laterality: Right

## 2023-08-10 MED ORDER — BISACODYL 5 MG PO TBEC: 5.0000 mg | DELAYED_RELEASE_TABLET | Freq: Every day | ORAL | Status: DC | PRN

## 2023-08-10 MED ORDER — POLYETHYLENE GLYCOL 3350 17 GM/SCOOP PO POWD
17.0000 g | Freq: Every day | ORAL | 0 refills | Status: AC
  Filled 2023-08-10: qty 238, 14d supply, fill #0

## 2023-08-10 MED ORDER — ASPIRIN 81 MG PO CHEW
81.0000 mg | CHEWABLE_TABLET | Freq: Two times a day (BID) | ORAL | Status: DC
Start: 2023-08-10 — End: 2023-08-11
  Administered 2023-08-10 – 2023-08-11 (×2): 81 mg via ORAL
  Filled 2023-08-10 (×2): qty 1

## 2023-08-10 MED ORDER — CEFAZOLIN SODIUM-DEXTROSE 2-4 GM/100ML-% IV SOLN
2.0000 g | Freq: Four times a day (QID) | INTRAVENOUS | Status: AC
  Administered 2023-08-10 (×2): 2 g via INTRAVENOUS
  Filled 2023-08-10 (×2): qty 100

## 2023-08-10 MED ORDER — RISAQUAD PO CAPS
1.0000 | ORAL_CAPSULE | Freq: Every day | ORAL | Status: DC
  Administered 2023-08-10 – 2023-08-11 (×2): 1 via ORAL
  Filled 2023-08-10 (×2): qty 1

## 2023-08-10 MED ORDER — DOCUSATE SODIUM 100 MG PO CAPS
100.0000 mg | ORAL_CAPSULE | Freq: Two times a day (BID) | ORAL | 2 refills | Status: AC
  Filled 2023-08-10: qty 60, 30d supply, fill #0

## 2023-08-10 MED ORDER — OXYCODONE HCL 5 MG PO TABS: 5.0000 mg | ORAL_TABLET | Freq: Once | ORAL | Status: DC | PRN

## 2023-08-10 MED ORDER — OXYCODONE HCL 5 MG PO TABS
5.0000 mg | ORAL_TABLET | ORAL | Status: DC | PRN
  Administered 2023-08-10 (×2): 10 mg via ORAL
  Filled 2023-08-10: qty 2

## 2023-08-10 MED ORDER — PHENYLEPHRINE HCL-NACL 20-0.9 MG/250ML-% IV SOLN
INTRAVENOUS | Status: DC | PRN
  Administered 2023-08-10: 20 ug/min via INTRAVENOUS

## 2023-08-10 MED ORDER — PROPOFOL 500 MG/50ML IV EMUL
INTRAVENOUS | Status: DC | PRN
  Administered 2023-08-10: 20 mg via INTRAVENOUS
  Administered 2023-08-10: 10 mg via INTRAVENOUS
  Administered 2023-08-10: 200 ug/kg/min via INTRAVENOUS

## 2023-08-10 MED ORDER — STERILE WATER FOR IRRIGATION IR SOLN
Status: DC | PRN
  Administered 2023-08-10: 1000 mL

## 2023-08-10 MED ORDER — LACTATED RINGERS IV SOLN: INTRAVENOUS | Status: DC

## 2023-08-10 MED ORDER — ACETAMINOPHEN 10 MG/ML IV SOLN
1000.0000 mg | INTRAVENOUS | Status: AC
  Administered 2023-08-10: 1000 mg via INTRAVENOUS
  Filled 2023-08-10: qty 100

## 2023-08-10 MED ORDER — LACTATED RINGERS IV SOLN
INTRAVENOUS | Status: DC
Start: 2023-08-10 — End: 2023-08-10

## 2023-08-10 MED ORDER — DOCUSATE SODIUM 100 MG PO CAPS
100.0000 mg | ORAL_CAPSULE | Freq: Two times a day (BID) | ORAL | Status: DC
  Administered 2023-08-10 – 2023-08-11 (×2): 100 mg via ORAL
  Filled 2023-08-10 (×2): qty 1

## 2023-08-10 MED ORDER — MAGNESIUM CITRATE PO SOLN
1.0000 | Freq: Once | ORAL | Status: DC | PRN
Start: 2023-08-10 — End: 2023-08-11

## 2023-08-10 MED ORDER — FENTANYL CITRATE (PF) 100 MCG/2ML IJ SOLN
INTRAMUSCULAR | Status: AC
  Filled 2023-08-10: qty 2

## 2023-08-10 MED ORDER — ONDANSETRON HCL 4 MG PO TABS: 4.0000 mg | ORAL_TABLET | Freq: Four times a day (QID) | ORAL | Status: DC | PRN

## 2023-08-10 MED ORDER — METOCLOPRAMIDE HCL 5 MG/ML IJ SOLN
5.0000 mg | Freq: Three times a day (TID) | INTRAMUSCULAR | Status: DC | PRN
Start: 2023-08-10 — End: 2023-08-11

## 2023-08-10 MED ORDER — OXYCODONE HCL 5 MG PO TABS
5.0000 mg | ORAL_TABLET | ORAL | 0 refills | Status: DC | PRN
  Filled 2023-08-10: qty 40, 7d supply, fill #0

## 2023-08-10 MED ORDER — ROPIVACAINE HCL 5 MG/ML IJ SOLN
INTRAMUSCULAR | Status: DC | PRN
  Administered 2023-08-10: 25 mL via PERINEURAL

## 2023-08-10 MED ORDER — MIDAZOLAM HCL 5 MG/5ML IJ SOLN
INTRAMUSCULAR | Status: DC | PRN
  Administered 2023-08-10: 2 mg via INTRAVENOUS

## 2023-08-10 MED ORDER — METHOCARBAMOL 1000 MG/10ML IJ SOLN
500.0000 mg | Freq: Four times a day (QID) | INTRAMUSCULAR | Status: DC | PRN
  Administered 2023-08-11: 500 mg via INTRAVENOUS
  Filled 2023-08-10: qty 10

## 2023-08-10 MED ORDER — DIPHENHYDRAMINE HCL 12.5 MG/5ML PO ELIX: 12.5000 mg | ORAL_SOLUTION | ORAL | Status: DC | PRN

## 2023-08-10 MED ORDER — HYDROMORPHONE HCL 1 MG/ML IJ SOLN
0.5000 mg | INTRAMUSCULAR | Status: DC | PRN
  Administered 2023-08-10: 1 mg via INTRAVENOUS
  Filled 2023-08-10: qty 1

## 2023-08-10 MED ORDER — INFLUENZA VIRUS VACC SPLIT PF (FLUZONE) 0.5 ML IM SUSY
0.5000 mL | PREFILLED_SYRINGE | INTRAMUSCULAR | Status: AC
  Administered 2023-08-11: 0.5 mL via INTRAMUSCULAR
  Filled 2023-08-10: qty 0.5

## 2023-08-10 MED ORDER — CEFAZOLIN SODIUM-DEXTROSE 2-4 GM/100ML-% IV SOLN
2.0000 g | INTRAVENOUS | Status: AC
  Administered 2023-08-10: 2 g via INTRAVENOUS
  Filled 2023-08-10: qty 100

## 2023-08-10 MED ORDER — METOCLOPRAMIDE HCL 5 MG PO TABS: 5.0000 mg | ORAL_TABLET | Freq: Three times a day (TID) | ORAL | Status: DC | PRN

## 2023-08-10 MED ORDER — FENTANYL CITRATE (PF) 100 MCG/2ML IJ SOLN
INTRAMUSCULAR | Status: DC | PRN
  Administered 2023-08-10: 25 ug via INTRAVENOUS
  Administered 2023-08-10: 75 ug via INTRAVENOUS

## 2023-08-10 MED ORDER — EPHEDRINE 5 MG/ML INJ
INTRAVENOUS | Status: AC
  Filled 2023-08-10: qty 5

## 2023-08-10 MED ORDER — METHOCARBAMOL 500 MG PO TABS
500.0000 mg | ORAL_TABLET | Freq: Three times a day (TID) | ORAL | 1 refills | Status: AC | PRN
  Filled 2023-08-10: qty 30, 10d supply, fill #0

## 2023-08-10 MED ORDER — SODIUM CHLORIDE 0.9 % IR SOLN
Status: DC | PRN
  Administered 2023-08-10: 250 mL
  Administered 2023-08-10: 1000 mL

## 2023-08-10 MED ORDER — EPHEDRINE SULFATE-NACL 50-0.9 MG/10ML-% IV SOSY
PREFILLED_SYRINGE | INTRAVENOUS | Status: DC | PRN
  Administered 2023-08-10: 5 mg via INTRAVENOUS

## 2023-08-10 MED ORDER — SODIUM CHLORIDE 0.9% FLUSH
INTRAVENOUS | Status: DC | PRN
  Administered 2023-08-10: 40 mL

## 2023-08-10 MED ORDER — PHENOL 1.4 % MT LIQD: 1.0000 | OROMUCOSAL | Status: DC | PRN

## 2023-08-10 MED ORDER — OXYCODONE HCL 5 MG/5ML PO SOLN
5.0000 mg | Freq: Once | ORAL | Status: DC | PRN
Start: 2023-08-10 — End: 2023-08-10

## 2023-08-10 MED ORDER — BUPIVACAINE LIPOSOME 1.3 % IJ SUSP
INTRAMUSCULAR | Status: AC
  Filled 2023-08-10: qty 20

## 2023-08-10 MED ORDER — ALUM & MAG HYDROXIDE-SIMETH 200-200-20 MG/5ML PO SUSP: 30.0000 mL | ORAL | Status: DC | PRN

## 2023-08-10 MED ORDER — ONDANSETRON HCL 4 MG/2ML IJ SOLN: 4.0000 mg | Freq: Four times a day (QID) | INTRAMUSCULAR | Status: DC | PRN

## 2023-08-10 MED ORDER — ORAL CARE MOUTH RINSE: 15.0000 mL | Freq: Once | OROMUCOSAL | Status: AC

## 2023-08-10 MED ORDER — DEXAMETHASONE SODIUM PHOSPHATE 10 MG/ML IJ SOLN
INTRAMUSCULAR | Status: DC | PRN
  Administered 2023-08-10: 10 mg via INTRAVENOUS

## 2023-08-10 MED ORDER — ASPIRIN 81 MG PO TBEC
81.0000 mg | DELAYED_RELEASE_TABLET | Freq: Two times a day (BID) | ORAL | 1 refills | Status: AC
  Filled 2023-08-10: qty 60, 30d supply, fill #0

## 2023-08-10 MED ORDER — FENTANYL CITRATE PF 50 MCG/ML IJ SOSY: 25.0000 ug | PREFILLED_SYRINGE | INTRAMUSCULAR | Status: DC | PRN

## 2023-08-10 MED ORDER — TRANEXAMIC ACID-NACL 1000-0.7 MG/100ML-% IV SOLN
1000.0000 mg | INTRAVENOUS | Status: AC
  Administered 2023-08-10: 1000 mg via INTRAVENOUS
  Filled 2023-08-10: qty 100

## 2023-08-10 MED ORDER — SODIUM CHLORIDE (PF) 0.9 % IJ SOLN
INTRAMUSCULAR | Status: AC
  Filled 2023-08-10: qty 50

## 2023-08-10 MED ORDER — ONDANSETRON HCL 4 MG/2ML IJ SOLN
INTRAMUSCULAR | Status: DC | PRN
  Administered 2023-08-10: 4 mg via INTRAVENOUS

## 2023-08-10 MED ORDER — MENTHOL 3 MG MT LOZG: 1.0000 | LOZENGE | OROMUCOSAL | Status: DC | PRN

## 2023-08-10 MED ORDER — 0.9 % SODIUM CHLORIDE (POUR BTL) OPTIME
TOPICAL | Status: DC | PRN
  Administered 2023-08-10: 1000 mL

## 2023-08-10 MED ORDER — OXYCODONE HCL 5 MG PO TABS
10.0000 mg | ORAL_TABLET | ORAL | Status: DC | PRN
  Administered 2023-08-11 (×2): 15 mg via ORAL
  Filled 2023-08-10: qty 2
  Filled 2023-08-10 (×2): qty 3

## 2023-08-10 MED ORDER — BUPIVACAINE IN DEXTROSE 0.75-8.25 % IT SOLN
INTRATHECAL | Status: DC | PRN
  Administered 2023-08-10: 1.7 mL via INTRATHECAL

## 2023-08-10 MED ORDER — BUPIVACAINE-EPINEPHRINE 0.25% -1:200000 IJ SOLN
INTRAMUSCULAR | Status: DC | PRN
  Administered 2023-08-10: 8 mL

## 2023-08-10 MED ORDER — CHLORHEXIDINE GLUCONATE 0.12 % MT SOLN
15.0000 mL | Freq: Once | OROMUCOSAL | Status: AC
  Administered 2023-08-10: 15 mL via OROMUCOSAL

## 2023-08-10 MED ORDER — KCL IN DEXTROSE-NACL 20-5-0.45 MEQ/L-%-% IV SOLN
INTRAVENOUS | Status: DC
Start: 2023-08-10 — End: 2023-08-11
  Filled 2023-08-10: qty 1000

## 2023-08-10 MED ORDER — ACETAMINOPHEN 500 MG PO TABS
1000.0000 mg | ORAL_TABLET | Freq: Four times a day (QID) | ORAL | Status: AC
  Administered 2023-08-10 – 2023-08-11 (×4): 1000 mg via ORAL
  Filled 2023-08-10 (×4): qty 2

## 2023-08-10 MED ORDER — PROPOFOL 10 MG/ML IV BOLUS
INTRAVENOUS | Status: AC
  Filled 2023-08-10: qty 20

## 2023-08-10 MED ORDER — POLYETHYLENE GLYCOL 3350 17 G PO PACK: 17.0000 g | PACK | Freq: Every day | ORAL | Status: DC | PRN

## 2023-08-10 MED ORDER — MIDAZOLAM HCL 2 MG/2ML IJ SOLN
INTRAMUSCULAR | Status: AC
  Filled 2023-08-10: qty 2

## 2023-08-10 MED ORDER — METHOCARBAMOL 500 MG PO TABS
500.0000 mg | ORAL_TABLET | Freq: Four times a day (QID) | ORAL | Status: DC | PRN
  Administered 2023-08-10 – 2023-08-11 (×2): 500 mg via ORAL
  Filled 2023-08-10 (×2): qty 1

## 2023-08-10 MED ORDER — ONDANSETRON HCL 4 MG/2ML IJ SOLN
4.0000 mg | Freq: Four times a day (QID) | INTRAMUSCULAR | Status: DC | PRN
Start: 2023-08-10 — End: 2023-08-11

## 2023-08-10 MED ORDER — BUPIVACAINE LIPOSOME 1.3 % IJ SUSP
INTRAMUSCULAR | Status: DC | PRN
  Administered 2023-08-10: 20 mL

## 2023-08-10 MED ORDER — BUPIVACAINE-EPINEPHRINE 0.25% -1:200000 IJ SOLN
INTRAMUSCULAR | Status: AC
Start: 2023-08-10 — End: ?
  Filled 2023-08-10: qty 1

## 2023-08-10 SURGICAL SUPPLY — 63 items
ATTUNE MED DOME PAT 41 KNEE (Knees) IMPLANT
ATTUNE PS FEM RT SZ 8 CEM KNEE (Femur) IMPLANT
ATTUNE PSRP INSR SZ8 5 KNEE (Insert) IMPLANT
BAG COUNTER SPONGE SURGICOUNT (BAG) IMPLANT
BAG DECANTER FOR FLEXI CONT (MISCELLANEOUS) ×1 IMPLANT
BAG ZIPLOCK 12X15 (MISCELLANEOUS) IMPLANT
BASE TIBIAL ROT PLAT SZ 8 KNEE (Knees) IMPLANT
BLADE SAW SGTL 11.0X1.19X90.0M (BLADE) ×1 IMPLANT
BLADE SAW SGTL 13.0X1.19X90.0M (BLADE) ×1 IMPLANT
BLADE SURG SZ10 CARB STEEL (BLADE) ×2 IMPLANT
BNDG ELASTIC 4INX 5YD STR LF (GAUZE/BANDAGES/DRESSINGS) ×1 IMPLANT
BNDG ELASTIC 6INX 5YD STR LF (GAUZE/BANDAGES/DRESSINGS) ×1 IMPLANT
BOWL SMART MIX CTS (DISPOSABLE) ×1 IMPLANT
CEMENT HV SMART SET (Cement) ×2 IMPLANT
COVER SURGICAL LIGHT HANDLE (MISCELLANEOUS) ×1 IMPLANT
CUFF TRNQT CYL 34X4.125X (TOURNIQUET CUFF) ×1 IMPLANT
DRAPE INCISE IOBAN 66X45 STRL (DRAPES) IMPLANT
DRAPE SHEET LG 3/4 BI-LAMINATE (DRAPES) ×1 IMPLANT
DRAPE SURG ORHT 6 SPLT 77X108 (DRAPES) ×2 IMPLANT
DRAPE TOP 10253 STERILE (DRAPES) ×1 IMPLANT
DRAPE U-SHAPE 47X51 STRL (DRAPES) ×1 IMPLANT
DRSG AQUACEL AG ADV 3.5X10 (GAUZE/BANDAGES/DRESSINGS) ×1 IMPLANT
DRSG TEGADERM 4X4.75 (GAUZE/BANDAGES/DRESSINGS) IMPLANT
DURAPREP 26ML APPLICATOR (WOUND CARE) ×1 IMPLANT
ELECT BLADE TIP CTD 4 INCH (ELECTRODE) ×1 IMPLANT
ELECT REM PT RETURN 15FT ADLT (MISCELLANEOUS) ×1 IMPLANT
EVACUATOR 1/8 PVC DRAIN (DRAIN) IMPLANT
GAUZE SPONGE 2X2 8PLY STRL LF (GAUZE/BANDAGES/DRESSINGS) IMPLANT
GLOVE BIO SURGEON STRL SZ7 (GLOVE) ×1 IMPLANT
GLOVE BIOGEL PI IND STRL 7.0 (GLOVE) ×1 IMPLANT
GLOVE BIOGEL PI IND STRL 8 (GLOVE) ×1 IMPLANT
GLOVE SURG SS PI 8.0 STRL IVOR (GLOVE) ×1 IMPLANT
GOWN STRL REUS W/ TWL XL LVL3 (GOWN DISPOSABLE) ×2 IMPLANT
HEMOSTAT SPONGE AVITENE ULTRA (HEMOSTASIS) IMPLANT
HOLDER FOLEY CATH W/STRAP (MISCELLANEOUS) IMPLANT
IMMOBILIZER KNEE 20 (SOFTGOODS) ×1
IMMOBILIZER KNEE 20 THIGH 36 (SOFTGOODS) ×1 IMPLANT
KIT TURNOVER KIT A (KITS) IMPLANT
MANIFOLD NEPTUNE II (INSTRUMENTS) ×1 IMPLANT
NS IRRIG 1000ML POUR BTL (IV SOLUTION) IMPLANT
PACK TOTAL KNEE CUSTOM (KITS) ×1 IMPLANT
PIN STEINMAN FIXATION KNEE (PIN) IMPLANT
PROTECTOR NERVE ULNAR (MISCELLANEOUS) ×1 IMPLANT
SAW OSC TIP CART 19.5X105X1.3 (SAW) IMPLANT
SEALER BIPOLAR AQUA 6.0 (INSTRUMENTS) IMPLANT
SET HNDPC FAN SPRY TIP SCT (DISPOSABLE) ×1 IMPLANT
SOLUTION PRONTOSAN WOUND 350ML (IRRIGATION / IRRIGATOR) ×1 IMPLANT
SPIKE FLUID TRANSFER (MISCELLANEOUS) ×1 IMPLANT
STAPLER VISISTAT (STAPLE) IMPLANT
STRIP CLOSURE SKIN 1/2X4 (GAUZE/BANDAGES/DRESSINGS) IMPLANT
SUT BONE WAX W31G (SUTURE) ×1 IMPLANT
SUT MNCRL AB 4-0 PS2 18 (SUTURE) IMPLANT
SUT STRATAFIX 0 PDS 27 VIOLET (SUTURE) ×1
SUT VIC AB 1 CT1 27XBRD ANTBC (SUTURE) ×3 IMPLANT
SUT VIC AB 2-0 CT1 TAPERPNT 27 (SUTURE) ×3 IMPLANT
SUTURE STRATFX 0 PDS 27 VIOLET (SUTURE) ×1 IMPLANT
SYR 3ML LL SCALE MARK (SYRINGE) IMPLANT
TIBIAL BASE ROT PLAT SZ 8 KNEE (Knees) ×1 IMPLANT
TRAY FOLEY MTR SLVR 16FR STAT (SET/KITS/TRAYS/PACK) ×1 IMPLANT
TUBE SUCTION HIGH CAP CLEAR NV (SUCTIONS) ×1 IMPLANT
WATER STERILE IRR 1000ML POUR (IV SOLUTION) ×1 IMPLANT
WIPE CHG 2% PREP (PERSONAL CARE ITEMS) ×1 IMPLANT
WRAP KNEE MAXI GEL POST OP (GAUZE/BANDAGES/DRESSINGS) ×1 IMPLANT

## 2023-08-10 NOTE — Anesthesia Preprocedure Evaluation (Signed)
Anesthesia Evaluation  Patient identified by MRN, date of birth, ID band Patient awake    Reviewed: Allergy & Precautions, H&P , NPO status , Patient's Chart, lab work & pertinent test results  Airway Mallampati: II   Neck ROM: full    Dental   Pulmonary neg pulmonary ROS   breath sounds clear to auscultation       Cardiovascular negative cardio ROS  Rhythm:regular Rate:Normal     Neuro/Psych    GI/Hepatic   Endo/Other    Renal/GU      Musculoskeletal  (+) Arthritis ,    Abdominal   Peds  Hematology   Anesthesia Other Findings   Reproductive/Obstetrics                             Anesthesia Physical Anesthesia Plan  ASA: 2  Anesthesia Plan: MAC and Spinal   Post-op Pain Management: Regional block*   Induction: Intravenous  PONV Risk Score and Plan: 1 and Propofol infusion and Treatment may vary due to age or medical condition  Airway Management Planned: Simple Face Mask  Additional Equipment:   Intra-op Plan:   Post-operative Plan:   Informed Consent: I have reviewed the patients History and Physical, chart, labs and discussed the procedure including the risks, benefits and alternatives for the proposed anesthesia with the patient or authorized representative who has indicated his/her understanding and acceptance.     Dental advisory given  Plan Discussed with: CRNA, Anesthesiologist and Surgeon  Anesthesia Plan Comments:        Anesthesia Quick Evaluation

## 2023-08-10 NOTE — Evaluation (Signed)
Physical Therapy Evaluation Patient Details Name: Jose Cochran MRN: [id number] DOB: 01/08/1971 Today's Date: 08/10/2023  History of Present Illness  Pt is 52 yo male s/p R TKA on 08/10/23.  Pt with hx of arthritis and R rotator cuff repair in 2017  Clinical Impression  Pt is s/p TKA resulting in the deficits listed below (see PT Problem List). Pt is healthy and active (crossfit) at baseline.  He has home support.  Pt does have flight of steps that he would like to be able to do at home - suspect pt will not have any issues with stairs at d/c.  Today, he presented with excellent quad activation, ROM, and pain control.  Pt ambulated 65' with RW and CGA.  Pt expected to progress very well with therapy.   Pt does report tendency to push self in regards to exercise and not taking meds - educated on staying on top of pain, not overdoing first week, and would provide with written HEP tomorrow.  Pt will benefit from acute skilled PT to increase their independence and safety with mobility to allow discharge.          If plan is discharge home, recommend the following: A little help with walking and/or transfers;A little help with bathing/dressing/bathroom;Assistance with cooking/housework;Help with stairs or ramp for entrance   Can travel by private vehicle        Equipment Recommendations Other (comment) (RW vs may prefer crutches (assess tomorrow))  Recommendations for Other Services       Functional Status Assessment Patient has had a recent decline in their functional status and demonstrates the ability to make significant improvements in function in a reasonable and predictable amount of time.     Precautions / Restrictions Precautions Precautions: Knee;Fall Restrictions Weight Bearing Restrictions Per Provider Order: Yes RLE Weight Bearing Per Provider Order: Weight bearing as tolerated      Mobility  Bed Mobility Overal bed mobility: Needs Assistance Bed Mobility: Supine to  Sit     Supine to sit: Supervision          Transfers Overall transfer level: Needs assistance Equipment used: Rolling walker (2 wheels) Transfers: Sit to/from Stand Sit to Stand: Contact guard assist           General transfer comment: cues for hand placement    Ambulation/Gait Ambulation/Gait assistance: Contact guard assist Gait Distance (Feet): 60 Feet Assistive device: Rolling walker (2 wheels) Gait Pattern/deviations: Step-through pattern Gait velocity: decreased but functional     General Gait Details: slight decrease in weight shift to R; steady gait; educated on sequencing and RW proximity  Careers information officer     Tilt Bed    Modified Rankin (Stroke Patients Only)       Balance Overall balance assessment: Needs assistance Sitting-balance support: No upper extremity supported Sitting balance-Leahy Scale: Normal     Standing balance support: Bilateral upper extremity supported, No upper extremity supported Standing balance-Leahy Scale: Fair Standing balance comment: could static stand without AD                             Pertinent Vitals/Pain Pain Assessment Pain Assessment: 0-10 Pain Score: 1  Pain Location: Rknee Pain Descriptors / Indicators: Discomfort Pain Intervention(s): Limited activity within patient's tolerance, Monitored during session, Repositioned, Ice applied, Other (comment)    Home Living Family/patient expects to be discharged to:: Private  residence Living Arrangements: Spouse/significant other Available Help at Discharge: Family;Available 24 hours/day Type of Home: House Home Access: Stairs to enter Entrance Stairs-Rails: Right Entrance Stairs-Number of Steps: 3 with rail on R to enter but then would want to go to basement to stay.  Could drive around to basement and have level entry. Alternate Level Stairs-Number of Steps: flight Home Layout: Two level Home Equipment: Shower  seat;Crutches;Cane - single point Additional Comments: walker but it was his mom who was 5'    Prior Function Prior Level of Function : Independent/Modified Independent;Working/employed;Driving             Mobility Comments: enjoys crossfit       Extremity/Trunk Assessment   Upper Extremity Assessment Upper Extremity Assessment: Overall WFL for tasks assessed (excellent upper body strength)    Lower Extremity Assessment Lower Extremity Assessment: LLE deficits/detail;RLE deficits/detail RLE Deficits / Details: Expected post op changes; ROM Knee ~5 to 90 degrees in ACE wrap; MMT: ankle 5/5, knee and hip 3/5 not further tested LLE Deficits / Details: ROM WFL; MMT 5/5    Cervical / Trunk Assessment Cervical / Trunk Assessment: Normal  Communication      Cognition Arousal: Alert Behavior During Therapy: WFL for tasks assessed/performed Overall Cognitive Status: Within Functional Limits for tasks assessed                                          General Comments General comments (skin integrity, edema, etc.): Pt reports being active and working out/crossfit.  States he will push himself and does not want to overdo , so will need guidelines.  Discussed would provide written HEP with parameters tomorrow.  Pt also reports he has tendency to not take pain meds and doesn't want pain to get out of control.  Educated on requesting meds before pain severe.  Pt reports low pain and would like meds - notified RN    Exercises     Assessment/Plan    PT Assessment Patient needs continued PT services  PT Problem List Decreased strength;Pain;Decreased range of motion;Decreased activity tolerance;Decreased knowledge of use of DME;Decreased balance;Decreased mobility       PT Treatment Interventions DME instruction;Therapeutic exercise;Gait training;Stair training;Therapeutic activities;Functional mobility training;Patient/family education;Balance training    PT Goals  (Current goals can be found in the Care Plan section)  Acute Rehab PT Goals Patient Stated Goal: return home; resume crossfit PT Goal Formulation: With patient/family Time For Goal Achievement: 08/24/23 Potential to Achieve Goals: Good    Frequency 7X/week     Co-evaluation               AM-PAC PT "6 Clicks" Mobility  Outcome Measure Help needed turning from your back to your side while in a flat bed without using bedrails?: None Help needed moving from lying on your back to sitting on the side of a flat bed without using bedrails?: A Little Help needed moving to and from a bed to a chair (including a wheelchair)?: A Little Help needed standing up from a chair using your arms (e.g., wheelchair or bedside chair)?: A Little Help needed to walk in hospital room?: A Little Help needed climbing 3-5 steps with a railing? : A Little 6 Click Score: 19    End of Session Equipment Utilized During Treatment: Gait belt Activity Tolerance: Patient tolerated treatment well Patient left: in chair;with call bell/phone within reach;with family/visitor  present Nurse Communication: Mobility status;Patient requests pain meds PT Visit Diagnosis: Other abnormalities of gait and mobility (R26.89);Muscle weakness (generalized) (M62.81)    Time: 9528-4132 PT Time Calculation (min) (ACUTE ONLY): 22 min   Charges:   PT Evaluation $PT Eval Low Complexity: 1 Low   PT General Charges $$ ACUTE PT VISIT: 1 Visit         Anise Salvo, PT Acute Rehab [facility] [phone]   Rayetta Humphrey 08/10/2023, 3:17 PM

## 2023-08-10 NOTE — Transfer of Care (Signed)
Immediate Anesthesia Transfer of Care Note  Patient: Jose Cochran  Procedure(s) Performed: TOTAL KNEE ARTHROPLASTY (Right: Knee)  Patient Location: PACU  Anesthesia Type:MAC  Level of Consciousness: sedated  Airway & Oxygen Therapy: Patient Spontanous Breathing and Patient connected to face mask  Post-op Assessment: Report given to RN  Post vital signs: Reviewed and stable  Last Vitals:  Vitals Value Taken Time  BP 98/61 08/10/23 1113  Temp    Pulse 60 08/10/23 1114  Resp 15 08/10/23 1114  SpO2 100 % 08/10/23 1114  Vitals shown include unfiled device data.  Last Pain:  Vitals:   08/10/23 0652  TempSrc:   PainSc: 0-No pain         Complications: No notable events documented.

## 2023-08-10 NOTE — Interval H&P Note (Signed)
History and Physical Interval Note:  08/10/2023 8:11 AM  Jose Cochran  has presented today for surgery, with the diagnosis of Right knee degenerative joint disease.  The various methods of treatment have been discussed with the patient and family. After consideration of risks, benefits and other options for treatment, the patient has consented to  Procedure(s): TOTAL KNEE ARTHROPLASTY (Right) as a surgical intervention.  The patient's history has been reviewed, patient examined, no change in status, stable for surgery.  I have reviewed the patient's chart and labs.  Questions were answered to the patient's satisfaction.     Javier Docker

## 2023-08-10 NOTE — Anesthesia Procedure Notes (Signed)
Anesthesia Regional Block: Adductor canal block   Pre-Anesthetic Checklist: , timeout performed,  Correct Patient, Correct Site, Correct Laterality,  Correct Procedure, Correct Position, site marked,  Risks and benefits discussed,  Surgical consent,  Pre-op evaluation,  At surgeon's request and post-op pain management  Laterality: Right  Prep: chloraprep       Needles:  Injection technique: Single-shot  Needle Type: Echogenic Needle     Needle Length: 9cm  Needle Gauge: 21     Additional Needles:   Narrative:  Start time: 08/10/2023 7:40 AM End time: 08/10/2023 7:50 AM Injection made incrementally with aspirations every 5 mL.  Performed by: Personally  Anesthesiologist: Achille Rich, MD  Additional Notes: Pt tolerated the procedure well.

## 2023-08-10 NOTE — Anesthesia Procedure Notes (Signed)
Spinal  Patient location during procedure: OR Start time: 08/10/2023 8:37 AM End time: 08/10/2023 8:39 AM Reason for block: surgical anesthesia Staffing Performed: anesthesiologist  Anesthesiologist: Achille Rich, MD Performed by: Achille Rich, MD Authorized by: Achille Rich, MD   Preanesthetic Checklist Completed: patient identified, IV checked, risks and benefits discussed, surgical consent, monitors and equipment checked, pre-op evaluation and timeout performed Spinal Block Patient position: sitting Prep: DuraPrep Patient monitoring: cardiac monitor, continuous pulse ox and blood pressure Approach: midline Location: L3-4 Injection technique: single-shot Needle Needle type: Pencan  Needle gauge: 24 G Needle length: 9 cm Assessment Sensory level: T10 Events: CSF return Additional Notes Functioning IV was confirmed and monitors were applied. Sterile prep and drape, including hand hygiene and sterile gloves were used. The patient was positioned and the spine was prepped. The skin was anesthetized with lidocaine.  Free flow of clear CSF was obtained prior to injecting local anesthetic into the CSF.  The spinal needle aspirated freely following injection.  The needle was carefully withdrawn.  The patient tolerated the procedure well.

## 2023-08-10 NOTE — Brief Op Note (Signed)
08/10/2023  8:11 AM  PATIENT:  Jose Cochran  52 y.o. male  PRE-OPERATIVE DIAGNOSIS:  Right knee degenerative joint disease  POST-OPERATIVE DIAGNOSIS:  * No post-op diagnosis entered *  PROCEDURE:  Procedure(s): TOTAL KNEE ARTHROPLASTY (Right)  SURGEON:  Surgeons and Role:     Jene Every, MD - Primary  The aquamantis was utilized for this case to help facilitate better hemostasis as patient was felt to be at increased risk of bleeding because of complex case requiring increased OR time and/or exposure.   PHYSICIAN ASSISTANT:   ASSISTANTS: Bissell   ANESTHESIA:   general  EBL:  50   BLOOD ADMINISTERED:none  DRAINS: none   LOCAL MEDICATIONS USED:  MARCAINE     SPECIMEN:  No Specimen  DISPOSITION OF SPECIMEN:  N/A  COUNTS:  YES  TOURNIQUET:   DICTATION: .Other Dictation: Dictation Number [id number]  PLAN OF CARE: Admit for overnight observation  PATIENT DISPOSITION:  PACU - hemodynamically stable.   Delay start of Pharmacological VTE agent (>24hrs) due to surgical blood loss or risk of bleeding: no

## 2023-08-10 NOTE — Discharge Instructions (Signed)
Elevate leg above heart 6x a day for each Use knee immobilizer while walking until can SLR x 10 Use knee immobilizer in bed to keep knee in extension Aquacel dressing may remain in place for one week. May shower with aquacel dressing in place. If the dressing becomes saturated or peels off prior to 1 week, you may remove aquacel dressing. Place new dressing with gauze and tape or ACE bandage which should be kept clean and dry and changed daily.  INSTRUCTIONS AFTER JOINT REPLACEMENT   Remove items at home which could result in a fall. This includes throw rugs or furniture in walking pathways ICE to the affected joint every three hours while awake for 30 minutes at a time, for at least the first 3-5 days, and then as needed for pain and swelling.  Continue to use ice for pain and swelling. You may notice swelling that will progress down to the foot and ankle.  This is normal after surgery.  Elevate your leg when you are not up walking on it.   Continue to use the breathing machine you got in the hospital (incentive spirometer) which will help keep your temperature down.  It is common for your temperature to cycle up and down following surgery, especially at night when you are not up moving around and exerting yourself.  The breathing machine keeps your lungs expanded and your temperature down.   DIET:  As you were doing prior to hospitalization, we recommend a well-balanced diet.  DRESSING / WOUND CARE / SHOWERING  You may change your dressing 1 week after surgery.  Then change the dressing every day with sterile gauze.  Please use good hand washing techniques before changing the dressing.  Do not use any lotions or creams on the incision until instructed by your surgeon.  ACTIVITY  Increase activity slowly as tolerated, but follow the weight bearing instructions below.   No driving for 6 weeks or until further direction given by your physician.  You cannot drive while taking narcotics.   No lifting or carrying greater than 10 lbs. until further directed by your surgeon. Avoid periods of inactivity such as sitting longer than an hour when not asleep. This helps prevent blood clots.  You may return to work once you are authorized by your doctor.     WEIGHT BEARING   Weight bearing as tolerated with assist device (walker, cane, etc) as directed, use it as long as suggested by your surgeon or therapist, typically at least 4-6 weeks.   EXERCISES  Results after joint replacement surgery are often greatly improved when you follow the exercise, range of motion and muscle strengthening exercises prescribed by your doctor. Safety measures are also important to protect the joint from further injury. Any time any of these exercises cause you to have increased pain or swelling, decrease what you are doing until you are comfortable again and then slowly increase them. If you have problems or questions, call your caregiver or physical therapist for advice.   Rehabilitation is important following a joint replacement. After just a few days of immobilization, the muscles of the leg can become weakened and shrink (atrophy).  These exercises are designed to build up the tone and strength of the thigh and leg muscles and to improve motion. Often times heat used for twenty to thirty minutes before working out will loosen up your tissues and help with improving the range of motion but do not use heat for the first two weeks  following surgery (sometimes heat can increase post-operative swelling).   These exercises can be done on a training (exercise) mat, on the floor, on a table or on a bed. Use whatever works the best and is most comfortable for you.    Use music or television while you are exercising so that the exercises are a pleasant break in your day. This will make your life better with the exercises acting as a break in your routine that you can look forward to.   Perform all exercises about  fifteen times, three times per day or as directed.  You should exercise both the operative leg and the other leg as well.  Exercises include:   Quad Sets - Tighten up the muscle on the front of the thigh (Quad) and hold for 5-10 seconds.   Straight Leg Raises - With your knee straight (if you were given a brace, keep it on), lift the leg to 60 degrees, hold for 3 seconds, and slowly lower the leg.  Perform this exercise against resistance later as your leg gets stronger.  Leg Slides: Lying on your back, slowly slide your foot toward your buttocks, bending your knee up off the floor (only go as far as is comfortable). Then slowly slide your foot back down until your leg is flat on the floor again.  Angel Wings: Lying on your back spread your legs to the side as far apart as you can without causing discomfort.  Hamstring Strength:  Lying on your back, push your heel against the floor with your leg straight by tightening up the muscles of your buttocks.  Repeat, but this time bend your knee to a comfortable angle, and push your heel against the floor.  You may put a pillow under the heel to make it more comfortable if necessary.   A rehabilitation program following joint replacement surgery can speed recovery and prevent re-injury in the future due to weakened muscles. Contact your doctor or a physical therapist for more information on knee rehabilitation.    CONSTIPATION  Constipation is defined medically as fewer than three stools per week and severe constipation as less than one stool per week.  Even if you have a regular bowel pattern at home, your normal regimen is likely to be disrupted due to multiple reasons following surgery.  Combination of anesthesia, postoperative narcotics, change in appetite and fluid intake all can affect your bowels.   YOU MUST use at least one of the following options; they are listed in order of increasing strength to get the job done.  They are all available over the  counter, and you may need to use some, POSSIBLY even all of these options:    Drink plenty of fluids (prune juice may be helpful) and high fiber foods Colace 100 mg by mouth twice a day  Senokot for constipation as directed and as needed Dulcolax (bisacodyl), take with full glass of water  Miralax (polyethylene glycol) once or twice a day as needed.  If you have tried all these things and are unable to have a bowel movement in the first 3-4 days after surgery call either your surgeon or your primary doctor.    If you experience loose stools or diarrhea, hold the medications until you stool forms back up.  If your symptoms do not get better within 1 week or if they get worse, check with your doctor.  If you experience "the worst abdominal pain ever" or develop nausea or vomiting, please contact  the office immediately for further recommendations for treatment.   ITCHING:  If you experience itching with your medications, try taking only a single pain pill, or even half a pain pill at a time.  You can also use Benadryl over the counter for itching or also to help with sleep.   TED HOSE STOCKINGS:  Use stockings on both legs until for at least 2 weeks or as directed by physician office. They may be removed at night for sleeping.  MEDICATIONS:  See your medication summary on the "After Visit Summary" that nursing will review with you.  You may have some home medications which will be placed on hold until you complete the course of blood thinner medication.  It is important for you to complete the blood thinner medication as prescribed.  PRECAUTIONS:  If you experience chest pain or shortness of breath - call 911 immediately for transfer to the hospital emergency department.   If you develop a fever greater that 101 F, purulent drainage from wound, increased redness or drainage from wound, foul odor from the wound/dressing, or calf pain - CONTACT YOUR SURGEON.                                                    FOLLOW-UP APPOINTMENTS:  If you do not already have a post-op appointment, please call the office for an appointment to be seen by your surgeon.  Guidelines for how soon to be seen are listed in your "After Visit Summary", but are typically between 1-4 weeks after surgery.  OTHER INSTRUCTIONS:   Knee Replacement:  Do not place pillow under knee, focus on keeping the knee straight while resting. CPM instructions: 0-90 degrees, 2 hours in the morning, 2 hours in the afternoon, and 2 hours in the evening. Place foam block, curve side up under heel at all times except when in CPM or when walking.  DO NOT modify, tear, cut, or change the foam block in any way.  POST-OPERATIVE OPIOID TAPER INSTRUCTIONS: It is important to wean off of your opioid medication as soon as possible. If you do not need pain medication after your surgery it is ok to stop day one. Opioids include: Codeine, Hydrocodone(Norco, Vicodin), Oxycodone(Percocet, oxycontin) and hydromorphone amongst others.  Long term and even short term use of opiods can cause: Increased pain response Dependence Constipation Depression Respiratory depression And more.  Withdrawal symptoms can include Flu like symptoms Nausea, vomiting And more Techniques to manage these symptoms Hydrate well Eat regular healthy meals Stay active Use relaxation techniques(deep breathing, meditating, yoga) Do Not substitute Alcohol to help with tapering If you have been on opioids for less than two weeks and do not have pain than it is ok to stop all together.  Plan to wean off of opioids This plan should start within one week post op of your joint replacement. Maintain the same interval or time between taking each dose and first decrease the dose.  Cut the total daily intake of opioids by one tablet each day Next start to increase the time between doses. The last dose that should be eliminated is the evening dose.   MAKE SURE YOU:  Understand  these instructions.  Get help right away if you are not doing well or get worse.    Thank you for letting us be a part  of your medical care team.  It is a privilege we respect greatly.  We hope these instructions will help you stay on track for a fast and full recovery!

## 2023-08-10 NOTE — Op Note (Unsigned)
NAME: Jose Cochran, Jose Cochran MEDICAL RECORD NO: [id number] ACCOUNT NO: [id number] DATE OF BIRTH: 04/14/1971 FACILITY: [facility] LOCATION: [facility] PHYSICIAN: Javier Docker, MD  Operative Report   DATE OF PROCEDURE: 08/10/2023  PREOPERATIVE DIAGNOSIS:  End-stage osteoarthrosis, varus deformity of the right knee.  POSTOPERATIVE DIAGNOSIS:  End-stage osteoarthrosis, varus deformity of the right knee.  PROCEDURE PERFORMED:  Right total knee arthroplasty utilizing DePuy rotating platform, 8 femur, 8 tibia, 5 mm insert, 41 patella.  ANESTHESIA:  Spinal.  ASSISTANT: Orland Penman, PA.  HISTORY:  This is a 52 year old who has had four years of progressive debilitating knee pain.  He has had multiple injections including viscosupplementation, physical therapy, unloader brace.  He progressed to bone-on-bone arthrosis, particularly the  medial compartment.  With a negative affect of his activities of daily living, he was indicated for replacement of the degenerated joint.  Risks and benefits discussed including bleeding, infection, damage to neurovascular structures, no change in  symptoms, worsening symptoms, DVT, PE, anesthetic complications, etc.  DESCRIPTION OF PROCEDURE:  The patient in the supine position.  After induction of adequate spinal anesthesia, 2 g of Kefzol the right lower extremity was prepped, draped, and exsanguinated in the usual sterile fashion.  The thigh tourniquet inflated to  225 mmHg.  A midline incision was then made over the knee.  Full-thickness flaps developed.  Medial parapatellar arthrotomy was performed.  Patella gently everted.  Knee was flexed.  Tricompartmental osteoarthrosis, particularly in the medial  compartment, bone on bone was noted.  Fat pad was debrided.  Remnants of medial and lateral menisci and ACL were excised.  A Leksell rongeur was utilized to fashion a notch just above the femoral notch as a starting point for the femoral drill, which  entered the  femoral canal in line with the femur.  The patient was noted to have fairly hard bone.  This was then irrigated and used a T-handle noting the intramedullary canal.  Intramedullary guide was used 10 off the distal femur due to a slight  flexion contracture.  A 5 degrees of external rotation.  This was then pinned.  I performed a distal femoral cut.  I then attempted to sublux the tibia.  The knee was fairly tight.  We had to address the femur.  We sized off the anterior cortex to an 8  and 3 degrees of external rotation, pinned.  I performed her anterior, posterior, and chamfer cuts.  Again, fairly hard bone was noted.  Flushed with the anterior cortex without notching.  Then, soft tissue was protected posteriorly at all times.  I then  subluxed the tibia, eburnated bone medially and posteromedially.  We used the external alignment guide, three off the defect, which was medial parallel to the shaft, 3-degree slope bisecting the tibiotalar joint.  This was then pinned.  This measured 9  off the lateral side.  I performed a tibial cut protecting soft tissues posteriorly at all times.  Then I sized the tibia to an 8, maximizing coverage just to the medial aspect of the tibial tubercle.  This was pinned, harvested bone centrally, and  impacted in the distal femur.  I drilled centrally and then a punch guide.  Prior to that, I used a Beyer rongeur to remove the eburnated bone.  I then turned attention back to the femur for a box cut.  We used our box cut jig and bisected the canal.   This was pinned.  I used a new saw blade due to again  hard bone.  Box cut was then performed without difficulty.  Then, I trialed the trial femur.  It did not sit completely flush.  I then revised some of the anterior cortex where there was eburnated  bone.  I then reinserted the femoral component.  This time it sit flush.  I used a 5 mm insert after drilling our lug holes, reduced it.  I had full extension, full flexion, good  stability to varus and valgus stress at 0 and 30 degrees.  Negative  anterior drawer.  Everted the patella measured to a 25 planned to a 15.5 utilizing the patellar jig.  We performed our cut.  It was satisfactory.  The residual was 15 mm.  Sized to a 41 with a paddle parallel to the joint surface, pinned, drilled our lug  holes, placed a trial patella, reduced it and had excellent patellofemoral tracking.  Next, all instrumentation was removed.  We checked posteriorly and the remnants of the menisci were excised with cauterized geniculates.  Capsule and popliteus was  intact.  With the knee flexed, I used Exparel through the posterior capsule 10 mL without breaking the vacuum and then injected into the medial and lateral menisci, proximal tibia, quadriceps tendon, etc.  Following this we used pulsatile lavage in the  joint, thoroughly cleaned all bony surfaces.  Cement was mixed on the back table under vacuum.  Knee was flexed.  All surfaces thoroughly dried and we used a drill guide with a pin to drill eburnated bone off the medial tibial plateau.  Cement was then  placed in the proximal tibia digitally pressurizing it.  On the tibial component, this was then impacted into place.  The femoral component was then cemented with cement on the femur into the femoral component.  It sit flush.  We placed a 5-mm insert,  reduced it, held an axial load in full extension throughout the curing of the cement.  We cemented and clamped the patella and placed Marcaine with epinephrine in the back of the joint and Prontosan and covered the incision during the curing of the  cement.  After the curing of the cement, the tourniquet was deflated at 70 minutes.  Any minor bleeding was cauterized with Aquamantys.  I had full flexion, full extension, good stability to varus and valgus stress at 0 or 30 degrees.  Negative anterior  drawer.  I removed the insert.  Meticulously removed all redundant cement.  Minor bleeding  posteriorly in the capsule was cauterized with an Aquamantys.  Pulsatile lavage to thoroughly clean the joint followed by Prontosan.  Five permanent polyethylene  insert was reduced and again I had full extension, full flexion, good stability with varus and valgus stress at 0 and 30 degrees.  Negative anterior drawer.  Following this, with the knee in mid flexion, we reapproximated the patellar arthrotomy with #1  Vicryl interrupted figure-of-eight sutures oversewn with a running Stratafix.  Following this, there was excellent patellofemoral tracking.  Next, copiously irrigated the subcutaneous tissue with Prontosan and subcutaneous with 2-0 and skin with staples.   A sterile dressing was applied, placed in the immobilizer, and transported to the recovery room in satisfactory condition.  The patient tolerated the procedure well with no complications.  Assistant, Orland Penman, PA is used throughout the case  for traction, exposure and closure.  BLOOD LOSS:  50 mL.   PUS D: 08/10/2023 10:52:25 am T: 08/10/2023 12:56:00 pm  JOB: [id number]/ [id number]

## 2023-08-11 ENCOUNTER — Encounter (HOSPITAL_COMMUNITY): Payer: Self-pay | Admitting: Specialist

## 2023-08-11 ENCOUNTER — Other Ambulatory Visit (HOSPITAL_COMMUNITY): Payer: Self-pay

## 2023-08-11 DIAGNOSIS — M1711 Unilateral primary osteoarthritis, right knee: Secondary | ICD-10-CM | POA: Diagnosis not present

## 2023-08-11 LAB — CBC
HCT: 40.7 % (ref 39.0–52.0)
Hemoglobin: 14 g/dL (ref 13.0–17.0)
MCH: 30.2 pg (ref 26.0–34.0)
MCHC: 34.4 g/dL (ref 30.0–36.0)
MCV: 87.7 fL (ref 80.0–100.0)
Platelets: 330 10*3/uL (ref 150–400)
RBC: 4.64 MIL/uL (ref 4.22–5.81)
RDW: 12.6 % (ref 11.5–15.5)
WBC: 16.7 10*3/uL — ABNORMAL HIGH (ref 4.0–10.5)
nRBC: 0 % (ref 0.0–0.2)

## 2023-08-11 MED ORDER — HYDROMORPHONE HCL 2 MG PO TABS
2.0000 mg | ORAL_TABLET | ORAL | Status: DC | PRN
Start: 2023-08-11 — End: 2023-08-11
  Administered 2023-08-11: 2 mg via ORAL
  Filled 2023-08-11: qty 2

## 2023-08-11 MED ORDER — HYDROMORPHONE HCL 2 MG/ML IJ SOLN
2.0000 mg | Freq: Once | INTRAMUSCULAR | Status: AC
  Administered 2023-08-11: 2 mg via INTRAVENOUS
  Filled 2023-08-11: qty 1

## 2023-08-11 MED ORDER — HYDROMORPHONE HCL 2 MG PO TABS
2.0000 mg | ORAL_TABLET | ORAL | 0 refills | Status: AC | PRN
  Filled 2023-08-11: qty 30, 5d supply, fill #0

## 2023-08-11 MED ORDER — KETOROLAC TROMETHAMINE 30 MG/ML IJ SOLN
30.0000 mg | Freq: Once | INTRAMUSCULAR | Status: AC
  Administered 2023-08-11: 30 mg via INTRAVENOUS
  Filled 2023-08-11: qty 1

## 2023-08-11 NOTE — TOC Transition Note (Signed)
Transition of Care [facility]) - Discharge Note   Patient Details  Name: Jose Cochran MRN: [id number] Date of Birth: 09/06/1970  Transition of Care [facility]) CM/SW Contact:  Amada Jupiter, LCSW Phone Number: 08/11/2023, 10:41 AM   Clinical Narrative:     Met with pt who confirms need for RW and no DME agency preference - order placed with [facility] and has been delivered to room.  Orders placed for HHPT and no HH agency pref - referral placed with [facility].  No further TOC needs.  Final next level of care: Home w Home Health Services Barriers to Discharge: No Barriers Identified   Patient Goals and CMS Choice Patient states their goals for this hospitalization and ongoing recovery are:: return home          Discharge Placement                       Discharge Plan and Services Additional resources added to the After Visit Summary for                  DME Arranged: Walker rolling DME Agency: [employer] Date DME Agency Contacted: 08/11/23 Time DME Agency Contacted: [phone] Representative spoke with at DME Agency: Loraine Leriche HH Arranged: PT HH Agency: [facility] Date HH Agency Contacted: 08/11/23 Time HH Agency Contacted: [phone] Representative spoke with at [facility] Agency: Adele Dan  Social Drivers of Health (SDOH) Interventions SDOH Screenings   Food Insecurity: No Food Insecurity (08/10/2023)  Housing: Low Risk  (08/10/2023)  Transportation Needs: No Transportation Needs (08/10/2023)  Utilities: Not At Risk (08/10/2023)  Financial Resource Strain: Low Risk  (03/11/2022)   Received from [facility] visits prior to 10/23/2022., [facility] visits prior to 10/23/2022.  Physical Activity: Sufficiently Active (03/11/2022)   Received from [facility] visits prior to 10/23/2022., [facility] visits prior to 10/23/2022.  Social Connections: Socially Integrated (03/11/2022)   Received from  [facility] visits prior to 10/23/2022., [facility] visits prior to 10/23/2022.  Stress: No Stress Concern Present (03/11/2022)   Received from [facility] visits prior to 10/23/2022., [facility] visits prior to 10/23/2022.  Tobacco Use: Unknown (08/10/2023)     Readmission Risk Interventions     No data to display

## 2023-08-11 NOTE — Progress Notes (Signed)
Subjective: 1 Day Post-Op Procedure(s) (LRB): TOTAL KNEE ARTHROPLASTY (Right) Patient reports pain as moderate and severe.    Objective: Vital signs in last 24 hours: Temp:  [97.8 F (36.6 C)-98.9 F (37.2 C)] 98.9 F (37.2 C) (12/19 1034) Pulse Rate:  [56-95] 71 (12/19 1034) Resp:  [12-18] 16 (12/19 1034) BP: (98-190)/(61-96) 172/93 (12/19 1034) SpO2:  [94 %-100 %] 97 % (12/19 1034)  Intake/Output from previous day: 12/18 0701 - 12/19 0700 In: 3701 [P.O.:2040; I.V.:1361; IV Piggyback:300] Out: 4550 [Urine:4500; Blood:50] Intake/Output this shift: No intake/output data recorded.  Recent Labs    08/11/23 0401  HGB 14.0   Recent Labs    08/11/23 0401  WBC 16.7*  RBC 4.64  HCT 40.7  PLT 330   No results for input(s): "NA", "K", "CL", "CO2", "BUN", "CREATININE", "GLUCOSE", "CALCIUM" in the last 72 hours. No results for input(s): "LABPT", "INR" in the last 72 hours.  Neurologically intact ABD soft Neurovascular intact Sensation intact distally Intact pulses distally Dorsiflexion/Plantar flexion intact Incision: dressing C/D/I and no drainage No cellulitis present Compartment soft No sign of DVT   Assessment/Plan: 1 Day Post-Op Procedure(s) (LRB): TOTAL KNEE ARTHROPLASTY (Right) Advance diet Up with therapy D/C IV fluids  Switch from oxy to dilaudid PO D/C to home today as long as pain better controlled  Patient's anticipated LOS is less than 2 midnights, meeting these requirements: - Younger than 49 - Lives within 1 hour of care - Has a competent adult at home to recover with post-op recover - NO history of  - Chronic pain requiring opiods  - Diabetes  - Coronary Artery Disease  - Heart failure  - Heart attack  - Stroke  - DVT/VTE  - Cardiac arrhythmia  - Respiratory Failure/COPD  - Renal failure  - Anemia  - Advanced Liver disease     Dorothy Spark 08/11/2023, 10:50 AM

## 2023-08-11 NOTE — Progress Notes (Signed)
Physical Therapy Treatment Patient Details Name: Jose Cochran MRN: [id number] DOB: [birth year] Today's Date: 08/11/2023   History of Present Illness Pt is 52 yo male s/p R TKA on 08/10/23.  Pt with hx of arthritis and R rotator cuff repair in 2017    PT Comments  Pt motivated and progressing well with mobility.  HEP initiated and pt up to ambulate increased distance in hall and negotiated stairs.  Pt eager for dc home this date.    If plan is discharge home, recommend the following: A little help with walking and/or transfers;A little help with bathing/dressing/bathroom;Assistance with cooking/housework;Help with stairs or ramp for entrance   Can travel by private vehicle        Equipment Recommendations  Rolling walker (2 wheels)    Recommendations for Other Services       Precautions / Restrictions Precautions Precautions: Knee;Fall Restrictions Weight Bearing Restrictions Per Provider Order: No RLE Weight Bearing Per Provider Order: Weight bearing as tolerated     Mobility  Bed Mobility Overal bed mobility: Needs Assistance Bed Mobility: Supine to Sit     Supine to sit: Supervision          Transfers Overall transfer level: Needs assistance Equipment used: Rolling walker (2 wheels) Transfers: Sit to/from Stand Sit to Stand: Supervision           General transfer comment: cues for hand placement    Ambulation/Gait Ambulation/Gait assistance: Contact guard assist, Supervision Gait Distance (Feet): 180 Feet Assistive device: Rolling walker (2 wheels) Gait Pattern/deviations: Step-through pattern Gait velocity: decreased but functional     General Gait Details: slight decrease in weight shift to R; steady gait; educated on sequencing and RW proximity   Stairs Stairs: Yes Stairs assistance: Contact guard assist Stair Management: One rail Left, Forwards, With crutches, Step to pattern Number of Stairs: 4 General stair comments: Pt self cues for  sequence   Wheelchair Mobility     Tilt Bed    Modified Rankin (Stroke Patients Only)       Balance Overall balance assessment: Needs assistance Sitting-balance support: No upper extremity supported Sitting balance-Leahy Scale: Normal     Standing balance support: No upper extremity supported Standing balance-Leahy Scale: Fair                              Cognition Arousal: Alert Behavior During Therapy: WFL for tasks assessed/performed Overall Cognitive Status: Within Functional Limits for tasks assessed                                          Exercises      General Comments        Pertinent Vitals/Pain Pain Assessment Pain Assessment: 0-10 Pain Score: 6  Pain Location: Rknee Pain Descriptors / Indicators: Aching, Sore Pain Intervention(s): Limited activity within patient's tolerance, Monitored during session, Premedicated before session, Ice applied    Home Living                          Prior Function            PT Goals (current goals can now be found in the care plan section) Acute Rehab PT Goals Patient Stated Goal: return home; resume crossfit PT Goal Formulation: With patient/family Time For Goal Achievement: 08/24/23 Potential to  Achieve Goals: Good Progress towards PT goals: Progressing toward goals    Frequency    7X/week      PT Plan      Co-evaluation              AM-PAC PT "6 Clicks" Mobility   Outcome Measure  Help needed turning from your back to your side while in a flat bed without using bedrails?: None Help needed moving from lying on your back to sitting on the side of a flat bed without using bedrails?: None Help needed moving to and from a bed to a chair (including a wheelchair)?: A Little Help needed standing up from a chair using your arms (e.g., wheelchair or bedside chair)?: A Little Help needed to walk in hospital room?: A Little Help needed climbing 3-5 steps with  a railing? : A Little 6 Click Score: 20    End of Session Equipment Utilized During Treatment: Gait belt Activity Tolerance: Patient tolerated treatment well Patient left: in chair;with call bell/phone within reach;with family/visitor present Nurse Communication: Mobility status;Patient requests pain meds PT Visit Diagnosis: Other abnormalities of gait and mobility (R26.89);Muscle weakness (generalized) (M62.81)     Time: 1610-9604 PT Time Calculation (min) (ACUTE ONLY): 30 min  Charges:    $Gait Training: 8-22 mins $Therapeutic Exercise: 8-22 mins PT General Charges $$ ACUTE PT VISIT: 1 Visit                     [name] PT Acute Rehabilitation Services Pager [phone] Office [phone]    [name] 08/11/2023, 10:47 AM

## 2023-08-11 NOTE — Anesthesia Postprocedure Evaluation (Signed)
Anesthesia Post Note  Patient: [name]  Procedure(s) Performed: TOTAL KNEE ARTHROPLASTY (Right: Knee)     Patient location during evaluation: PACU Anesthesia Type: MAC, Spinal and Regional Level of consciousness: oriented and awake and alert Pain management: pain level controlled Vital Signs Assessment: post-procedure vital signs reviewed and stable Respiratory status: spontaneous breathing, respiratory function stable and patient connected to nasal cannula oxygen Cardiovascular status: blood pressure returned to baseline and stable Postop Assessment: no headache, no backache and no apparent nausea or vomiting Anesthetic complications: no   No notable events documented.  Last Vitals:  Vitals:   08/11/23 0117 08/11/23 0545  BP: (!) 170/96 (!) 153/91  Pulse: 66 84  Resp: 16 15  Temp: 36.9 C 37 C  SpO2: 98% 94%    Last Pain:  Vitals:   08/11/23 0901  TempSrc:   PainSc: 4                  Jose Cochran S

## 2023-08-11 NOTE — Progress Notes (Signed)
Subjective: 1 Day Post-Op Procedure(s) (LRB): TOTAL KNEE ARTHROPLASTY (Right) Patient reports pain as 4 on 0-10 scale.   Denies CP or SOB.  Voiding without difficulty. Positive flatus. Objective: Vital signs in last 24 hours: Temp:  [97.8 F (36.6 C)-98.8 F (37.1 C)] 98.6 F (37 C) (12/19 0545) Pulse Rate:  [56-95] 84 (12/19 0545) Resp:  [12-18] 15 (12/19 0545) BP: (98-190)/(61-96) 153/91 (12/19 0545) SpO2:  [94 %-100 %] 94 % (12/19 0545)  Intake/Output from previous day: 12/18 0701 - 12/19 0700 In: 3701 [P.O.:2040; I.V.:1361; IV Piggyback:300] Out: 4550 [Urine:4500; Blood:50] Intake/Output this shift: No intake/output data recorded.  Recent Labs    08/11/23 0401  HGB 14.0   Recent Labs    08/11/23 0401  WBC 16.7*  RBC 4.64  HCT 40.7  PLT 330   No results for input(s): "NA", "K", "CL", "CO2", "BUN", "CREATININE", "GLUCOSE", "CALCIUM" in the last 72 hours. No results for input(s): "LABPT", "INR" in the last 72 hours.  Neurologically intact Neurovascular intact Sensation intact distally Intact pulses distally Dorsiflexion/Plantar flexion intact Incision: dressing C/D/I Compartment soft No DVT  Assessment/Plan:  1 Day Post-Op Procedure(s) (LRB): TOTAL KNEE ARTHROPLASTY (Right) Advance diet Up with therapy Discharge home with home health   Principal Problem:   Right knee DJD      Javier Docker 08/11/2023, @[facility] 

## 2023-08-11 NOTE — Progress Notes (Signed)
Discharge package printed and instructions given to pt. Pt verbalizes understanding. 

## 2023-08-11 NOTE — Plan of Care (Signed)

## 2023-09-12 ENCOUNTER — Other Ambulatory Visit (HOSPITAL_COMMUNITY): Payer: Self-pay
